# Patient Record
Sex: Female | Born: 1935 | Hispanic: Yes | Marital: Single | Smoking: Never smoker
Health system: Southern US, Community
[De-identification: ages and names within clinical notes are randomized; demographics above are authoritative.]

## PROBLEM LIST (undated history)

## (undated) DIAGNOSIS — H35039 Hypertensive retinopathy, unspecified eye: Secondary | ICD-10-CM

## (undated) DIAGNOSIS — K219 Gastro-esophageal reflux disease without esophagitis: Secondary | ICD-10-CM

## (undated) DIAGNOSIS — M069 Rheumatoid arthritis, unspecified: Secondary | ICD-10-CM

## (undated) HISTORY — DX: Hypertensive retinopathy, unspecified eye: H35.039

---

## 2006-07-09 HISTORY — PX: EYE SURGERY: SHX253

## 2006-07-09 HISTORY — PX: CATARACT EXTRACTION: SUR2

## 2016-07-09 HISTORY — PX: CHOLECYSTECTOMY, LAPAROSCOPIC: SHX56

## 2017-04-10 DIAGNOSIS — M069 Rheumatoid arthritis, unspecified: Secondary | ICD-10-CM | POA: Insufficient documentation

## 2017-04-10 DIAGNOSIS — I10 Essential (primary) hypertension: Secondary | ICD-10-CM | POA: Insufficient documentation

## 2017-04-10 DIAGNOSIS — F32A Depression, unspecified: Secondary | ICD-10-CM | POA: Insufficient documentation

## 2017-04-10 DIAGNOSIS — K219 Gastro-esophageal reflux disease without esophagitis: Secondary | ICD-10-CM | POA: Insufficient documentation

## 2017-04-10 DIAGNOSIS — M810 Age-related osteoporosis without current pathological fracture: Secondary | ICD-10-CM | POA: Insufficient documentation

## 2017-04-10 DIAGNOSIS — M6281 Muscle weakness (generalized): Secondary | ICD-10-CM | POA: Insufficient documentation

## 2017-04-10 DIAGNOSIS — D509 Iron deficiency anemia, unspecified: Secondary | ICD-10-CM | POA: Insufficient documentation

## 2017-04-10 DIAGNOSIS — F329 Major depressive disorder, single episode, unspecified: Secondary | ICD-10-CM | POA: Insufficient documentation

## 2018-12-18 ENCOUNTER — Other Ambulatory Visit: Payer: Self-pay | Admitting: Rheumatology

## 2018-12-18 DIAGNOSIS — M810 Age-related osteoporosis without current pathological fracture: Secondary | ICD-10-CM

## 2019-03-10 NOTE — Progress Notes (Addendum)
[facility] - Clinic Note  03/11/2019     CHIEF COMPLAINT Patient presents for Retina Evaluation   HISTORY OF PRESENT ILLNESS: Theresa Kim is a 83 y.o. female who presents to the clinic today for:   HPI    Retina Evaluation    In both eyes.  Onset: Unknown.  Duration of months.  Associated Symptoms Distortion and Floaters.  Negative for Flashes, Pain, Photophobia, Trauma, Jaw Claudication, Fever and Fatigue.  Context:  distance vision, mid-range vision and near vision.  Treatments tried include artificial tears.  Response to treatment was no improvement.  I, the attending physician,  performed the HPI with the patient and updated documentation appropriately.          Comments    83 y/o female pt referred by Dr. Nile Riggs for eval of ARMD OU.  Saw Dr. Nile Riggs yesterday for routine eye exam.  VA blurred OU x several months.  Also sees a "line" running through vision OU.  Denies pain, flashes, but reports a few small floaters OU.  OD frequently has FBS, stings and tears a lot.  Has tried AT in the past, but says they did nothing to relieve symptoms.       Last edited by Rennis Chris, MD on 03/11/2019 12:08 PM. (History)    Patient states she went to see Dr. Nile Riggs bc her vision has been getting blurry over the past couple of months. Patient states she has a hard time reading; and she states she has RA and takes methotrexate. Daughter states patient has taken hydroxychloroquine in the past for a significant amount of time. Stopped hydroxychloroquine last year. Significant health issues in the past due to gall bladder problems (hospice involved at one point), but patient's health has significantly improved since.   Referring physician: Jethro Bolus, MD [id number] [place],  [place] [address]  HISTORICAL INFORMATION:   Selected notes from the MEDICAL RECORD NUMBER Referred by Dr. Jethro Bolus for exu ARMD OU LEE:  Ocular Hx- PMH   CURRENT MEDICATIONS: No  current outpatient medications on file. (Ophthalmic Drugs)   No current facility-administered medications for this visit.  (Ophthalmic Drugs)   Current Outpatient Medications (Other)  Medication Sig  . benzonatate (TESSALON) 100 MG capsule TAKE 1 CAPSULE BY MOUTH THREE TIMES A DAY  . doxycycline (VIBRAMYCIN) 100 MG capsule Take 100 mg by mouth 2 (two) times daily.  . folic acid (FOLVITE) 1 MG tablet Take 1 mg by mouth daily.  Marland Kitchen gabapentin (NEURONTIN) 300 MG capsule Take 300 mg by mouth 2 (two) times daily.  . methotrexate (RHEUMATREX) 2.5 MG tablet TAKE 4 TABLET ALL AT THE SAME TIME ONCE A WEEK ORALLY 30 DAYS  . ofloxacin (FLOXIN) 0.3 % OTIC solution PLACE 10 DROPS INTO AFFECTED EAR(S) ONCE DAILY  . predniSONE (DELTASONE) 5 MG tablet   . traZODone (DESYREL) 100 MG tablet Take 100 mg by mouth daily.   No current facility-administered medications for this visit.  (Other)      REVIEW OF SYSTEMS: ROS    Positive for: Eyes   Negative for: Constitutional, Gastrointestinal, Neurological, Skin, Genitourinary, Musculoskeletal, HENT, Endocrine, Cardiovascular, Respiratory, Psychiatric, Allergic/Imm, Heme/Lymph   Last edited by Celine Mans, COA on 03/11/2019  9:12 AM. (History)       ALLERGIES No Known Allergies  PAST MEDICAL HISTORY History reviewed. No pertinent past medical history. History reviewed. No pertinent surgical history.  FAMILY HISTORY History reviewed. No pertinent family history.  SOCIAL HISTORY Social  History   Tobacco Use  . Smoking status: Not on file  Substance Use Topics  . Alcohol use: Not on file  . Drug use: Not on file         OPHTHALMIC EXAM:  Base Eye Exam    Visual Acuity (Snellen - Linear)      Right Left   Dist [place] 20/60 -2 20/50 -2   Dist ph [place] NI NI       Tonometry (Tonopen, 9:13 AM)      Right Left   Pressure 14 13       Pupils      Dark Light Shape React APD   Right 4 3 Round Slow None   Left 4 3 Round Slow None        Visual Fields (Counting fingers)      Left Right    Full Full       Extraocular Movement      Right Left    Full, Ortho Full, Ortho       Neuro/Psych    Oriented x3: Yes   Mood/Affect: Normal       Dilation    Both eyes: 1.0% Mydriacyl, 2.5% Phenylephrine @ 9:13 AM        Slit Lamp and Fundus Exam    Slit Lamp Exam      Right Left   Lids/Lashes Dermatochalasis - lower lid, mild telangectasia and MGD Dermatochalasis - lower lid, mild telangectasia and MGD   Conjunctiva/Sclera mild conjunctivochalasis mild conjunctivochalasis   Cornea 1-2 + PEE, well healed temporal cataract wound, 1-2 + PEE, well healed temporal cataract wound,   Anterior Chamber no cell or flare no cell or flare   Iris round and dilated round and dilated   Lens multifocal PCIOL, trace PCO multifocal PCIOL   Vitreous syneresis, PVD, scattered vitreous condensations syneresis, PVD, scattered vitreous condensations       Fundus Exam      Right Left   Disc mild pallor, + PPA, sharp rim trace pallor, + temp PPA, sharp rim   C/D Ratio 0.3 0.4   Macula Flat, no heme, blunted foveal reflex,inf crescent of RPE atrophy, mild ERM, RPE mottling and clumping, +plaquenil toxicity Flat, no heme blunted foveal reflex,inf crescent of RPE atrophy, mild ERM, RPE mottling and clumping, +plaquenil toxicity   Vessels AV crossing changes, Copper wiring, Tortuous, Vascular attenuation AV crossing changes, Copper wiring, Tortuous, Vascular attenuation   Periphery attached, scattered RPE changes, peripheral drusen attached, scattered RPE changes, peripheral drusen        Refraction    Manifest Refraction      Sphere Cylinder Dist [place]   Right [place] Sphere 20/60-   Left [place] Sphere 20/50-  Spent a good bit of time on the refraction, but no matter what I showed her, pt always said that pl sph OU looked the clearest.          IMAGING AND PROCEDURES  Imaging and Procedures for @TODAY @  OCT, Retina - OU - Both Eyes        Right Eye Quality was good. Central Foveal Thickness: 234. Progression has no prior data. Findings include abnormal foveal contour, no IRF, subretinal fluid, outer retinal atrophy, retinal drusen .   Left Eye Quality was good. Central Foveal Thickness: 257. Progression has no prior data. Findings include no IRF, abnormal foveal contour, retinal drusen , subretinal fluid, outer retinal atrophy.   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 CME - cystoid macular edema. PED - pigment epithelial detachment. IRF - intraretinal fluid. SRF - subretinal fluid. EZ - ellipsoid zone. ERM - epiretinal membrane. ORA - outer retinal atrophy. ORT - outer retinal tubulation. SRHM - subretinal hyper-reflective material        Fluorescein Angiography Optos (Transit OD)       Right Eye   Progression has no prior data. Early phase findings include staining, window defect. Mid/Late phase findings include staining, window defect, leakage (Mild, focal patches of hyperfluorescent leakage -- temporal periphery).   Left Eye   Progression has no prior data. Early phase findings include staining, window defect. Mid/Late phase findings include staining, window defect, leakage (Mild, focal patches of hyperfluorescent leakage -- temporal periphery).   Notes Images stored on drive;   Impression: Inferior paracentral, semi-circular staining/window defect consistent with plaquenil toxicity OU Focal patches of mild hyperfluorescent leakage, temporal periphery, consistent with focal, peripheral chorioretinal inflammation Scattered staining of peripheral drusen and peripheral RPE changes                  ASSESSMENT/PLAN:    ICD-10-CM   1. 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 Retinal edema  H35.81 OCT, Retina - OU - Both Eyes  4. Essential hypertension  I10   5. Hypertensive retinopathy of both eyes  H35.033 Fluorescein Angiography Optos (Transit OD)  6. 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 Pseudophakia of both eyes  Z96.1     1,2. Macular atrophy / Plaquenil toxicity OU  - history of long-term plaquenil use (years) for RA -- unknown dosage, but pt is very petite  - plaquenil was stopped about 1 year ago  - exam and OCT show characteristic parafoveal pattern of macular atrophy consistent with plaquenil toxicity  - discussed findings and prognosis  - no intervention indicated at this time  - monitor  - f/u 6 months, sooner prn  3. No retinal edema on exam or OCT  4,5. Hypertensive retinopathy OU - discussed importance of tight BP control - monitor  6,7. Rheumatoid arthritis w/ focal, peripheral chorioretinal inflammation OU - RA under the expert management of Dr. Zenovia JordanAngela Hawkes, Rheumatology - currently tapering off po prednisone (currently taking 7.5 mg daily) and on MTX 15mg /wk + folic acid - FA today (9.2.2020) shows mild focal patches of peripheral hyperfluorescent leakage OU - suggests there may be a mild uveitic component (focal peripheral chorioretinal inflammation) to her RA - no acute changes in therapy recommended at this time as pt is already on MTX and po prednisone - will send letter to Dr. Nickola MajorHawkes to let her know of these findings of ocular inflammation - f/u here in 6 mos, sooner prn for recheck of focal peripheral chorioretinal inflammation  8. Pseudophakia OU  - s/p CE/multifocal IOL OU [place], [place]  - beautiful surgeries, doing well  - monitor     Ophthalmic Meds Ordered this visit:  No orders of the defined types were placed in this encounter.       Return in about 6 months (around 09/08/2019) for f/u Plaquenil toxicity OU, DFE, OCT.  There are no Patient  Instructions on file for this visit.   Explained the diagnoses, plan, and follow up with the patient and they expressed understanding.  Patient expressed understanding of the importance of proper follow up care.   This document serves as a record of services personally performed by Gardiner Sleeper, MD, PhD. It was created on their behalf by Ernest Mallick, OA, an ophthalmic assistant. The creation of this record is the provider's dictation and/or activities during the visit.    Electronically signed by: Ernest Mallick, OA  09.01.2020 12:44 PM    Gardiner Sleeper, M.D., Ph.D. Diseases & Surgery of the Retina and Vitreous [facility]  I have reviewed the above documentation for accuracy and completeness, and I agree with the above. Gardiner Sleeper, M.D., Ph.D. 03/11/19 12:44 PM   Abbreviations: M myopia (nearsighted); A astigmatism; H hyperopia (farsighted); P presbyopia; Mrx spectacle prescription;  CTL contact lenses; OD right eye; OS left eye; OU both eyes  XT exotropia; ET esotropia; PEK punctate epithelial keratitis; PEE punctate epithelial erosions; DES dry eye syndrome; MGD meibomian gland dysfunction; ATs artificial tears; PFAT's preservative free artificial tears; [place] nuclear sclerotic cataract; PSC posterior subcapsular cataract; ERM epi-retinal membrane; PVD posterior vitreous detachment; RD retinal detachment; DM diabetes mellitus; DR diabetic retinopathy; NPDR non-proliferative diabetic retinopathy; PDR proliferative diabetic retinopathy; CSME clinically significant macular edema; DME diabetic macular edema; dbh dot blot hemorrhages; CWS cotton wool spot; POAG primary open angle glaucoma; C/D cup-to-disc ratio; HVF humphrey visual field; GVF goldmann visual field; OCT optical coherence tomography; IOP intraocular pressure; BRVO Branch retinal vein occlusion;  CRVO central retinal vein occlusion; CRAO central retinal artery occlusion; BRAO branch retinal artery occlusion; RT retinal tear; SB scleral buckle; PPV pars plana vitrectomy; VH Vitreous hemorrhage; PRP panretinal laser photocoagulation; IVK intravitreal kenalog; VMT vitreomacular traction; MH Macular hole;  NVD neovascularization of the disc; NVE neovascularization elsewhere; AREDS age related eye disease study; ARMD age related macular degeneration; POAG primary open angle glaucoma; EBMD epithelial/anterior basement membrane dystrophy; ACIOL anterior chamber intraocular lens; IOL intraocular lens; PCIOL posterior chamber intraocular lens; Phaco/IOL phacoemulsification with intraocular lens placement; F[place]in photorefractive keratectomy; LASIK laser assisted in situ keratomileusis; HTN hypertension; DM diabetes mellitus; COPD chronic obstructive pulmonary disease

## 2019-03-11 ENCOUNTER — Ambulatory Visit (INDEPENDENT_AMBULATORY_CARE_PROVIDER_SITE_OTHER): Payer: Medicare PPO | Admitting: Ophthalmology

## 2019-03-11 ENCOUNTER — Other Ambulatory Visit: Payer: Self-pay

## 2019-03-11 ENCOUNTER — Encounter (INDEPENDENT_AMBULATORY_CARE_PROVIDER_SITE_OTHER): Payer: Self-pay | Admitting: Ophthalmology

## 2019-03-11 DIAGNOSIS — H3589 Other specified retinal disorders: Secondary | ICD-10-CM

## 2019-03-11 DIAGNOSIS — H30003 Unspecified focal chorioretinal inflammation, bilateral: Secondary | ICD-10-CM

## 2019-03-11 DIAGNOSIS — M069 Rheumatoid arthritis, unspecified: Secondary | ICD-10-CM

## 2019-03-11 DIAGNOSIS — H3581 Retinal edema: Secondary | ICD-10-CM

## 2019-03-11 DIAGNOSIS — H35033 Hypertensive retinopathy, bilateral: Secondary | ICD-10-CM

## 2019-03-11 DIAGNOSIS — H35389 Toxic maculopathy, unspecified eye: Secondary | ICD-10-CM

## 2019-03-11 DIAGNOSIS — I10 Essential (primary) hypertension: Secondary | ICD-10-CM

## 2019-03-11 DIAGNOSIS — Z961 Presence of intraocular lens: Secondary | ICD-10-CM

## 2019-04-10 ENCOUNTER — Other Ambulatory Visit: Payer: Self-pay

## 2019-04-10 ENCOUNTER — Ambulatory Visit
Admission: RE | Admit: 2019-04-10 | Discharge: 2019-04-10 | Disposition: A | Discharge status: Home / Self Care | Payer: Medicare PPO | Source: Ambulatory Visit | Attending: Rheumatology | Admitting: Rheumatology

## 2019-04-10 DIAGNOSIS — M810 Age-related osteoporosis without current pathological fracture: Secondary | ICD-10-CM

## 2019-05-29 ENCOUNTER — Emergency Department (HOSPITAL_COMMUNITY): Payer: Medicare PPO

## 2019-05-29 ENCOUNTER — Other Ambulatory Visit: Payer: Self-pay

## 2019-05-29 ENCOUNTER — Inpatient Hospital Stay (HOSPITAL_COMMUNITY): Payer: Medicare PPO

## 2019-05-29 ENCOUNTER — Encounter (HOSPITAL_COMMUNITY): Payer: Self-pay | Admitting: Physician Assistant

## 2019-05-29 ENCOUNTER — Inpatient Hospital Stay (HOSPITAL_COMMUNITY)
Admission: EM | Admit: 2019-05-29 | Discharge: 2019-06-01 | DRG: 445 | Disposition: A | Discharge status: Home Health | Payer: Medicare PPO | Attending: Family Medicine | Admitting: Family Medicine

## 2019-05-29 DIAGNOSIS — K8050 Calculus of bile duct without cholangitis or cholecystitis without obstruction: Secondary | ICD-10-CM | POA: Diagnosis present

## 2019-05-29 DIAGNOSIS — K8309 Other cholangitis: Secondary | ICD-10-CM | POA: Diagnosis present

## 2019-05-29 DIAGNOSIS — K8030 Calculus of bile duct with cholangitis, unspecified, without obstruction: Secondary | ICD-10-CM | POA: Diagnosis present

## 2019-05-29 DIAGNOSIS — M4856XA Collapsed vertebra, not elsewhere classified, lumbar region, initial encounter for fracture: Secondary | ICD-10-CM | POA: Diagnosis present

## 2019-05-29 DIAGNOSIS — R0989 Other specified symptoms and signs involving the circulatory and respiratory systems: Secondary | ICD-10-CM | POA: Diagnosis present

## 2019-05-29 DIAGNOSIS — Z9049 Acquired absence of other specified parts of digestive tract: Secondary | ICD-10-CM

## 2019-05-29 DIAGNOSIS — R0781 Pleurodynia: Secondary | ICD-10-CM

## 2019-05-29 DIAGNOSIS — I10 Essential (primary) hypertension: Secondary | ICD-10-CM | POA: Diagnosis present

## 2019-05-29 DIAGNOSIS — K5730 Diverticulosis of large intestine without perforation or abscess without bleeding: Secondary | ICD-10-CM | POA: Diagnosis present

## 2019-05-29 DIAGNOSIS — N83202 Unspecified ovarian cyst, left side: Secondary | ICD-10-CM | POA: Diagnosis present

## 2019-05-29 DIAGNOSIS — G629 Polyneuropathy, unspecified: Secondary | ICD-10-CM | POA: Insufficient documentation

## 2019-05-29 DIAGNOSIS — K30 Functional dyspepsia: Secondary | ICD-10-CM | POA: Diagnosis present

## 2019-05-29 DIAGNOSIS — Z20828 Contact with and (suspected) exposure to other viral communicable diseases: Secondary | ICD-10-CM | POA: Diagnosis present

## 2019-05-29 DIAGNOSIS — M439 Deforming dorsopathy, unspecified: Secondary | ICD-10-CM

## 2019-05-29 DIAGNOSIS — K219 Gastro-esophageal reflux disease without esophagitis: Secondary | ICD-10-CM | POA: Diagnosis present

## 2019-05-29 DIAGNOSIS — Z79899 Other long term (current) drug therapy: Secondary | ICD-10-CM | POA: Diagnosis not present

## 2019-05-29 DIAGNOSIS — I959 Hypotension, unspecified: Secondary | ICD-10-CM | POA: Diagnosis not present

## 2019-05-29 DIAGNOSIS — Z967 Presence of other bone and tendon implants: Secondary | ICD-10-CM

## 2019-05-29 DIAGNOSIS — I4510 Unspecified right bundle-branch block: Secondary | ICD-10-CM | POA: Diagnosis present

## 2019-05-29 DIAGNOSIS — F329 Major depressive disorder, single episode, unspecified: Secondary | ICD-10-CM | POA: Diagnosis present

## 2019-05-29 DIAGNOSIS — M069 Rheumatoid arthritis, unspecified: Secondary | ICD-10-CM | POA: Diagnosis present

## 2019-05-29 DIAGNOSIS — R54 Age-related physical debility: Secondary | ICD-10-CM | POA: Diagnosis present

## 2019-05-29 DIAGNOSIS — J9811 Atelectasis: Secondary | ICD-10-CM | POA: Diagnosis present

## 2019-05-29 DIAGNOSIS — J302 Other seasonal allergic rhinitis: Secondary | ICD-10-CM | POA: Diagnosis present

## 2019-05-29 DIAGNOSIS — L940 Localized scleroderma [morphea]: Secondary | ICD-10-CM | POA: Insufficient documentation

## 2019-05-29 DIAGNOSIS — M4854XA Collapsed vertebra, not elsewhere classified, thoracic region, initial encounter for fracture: Secondary | ICD-10-CM | POA: Diagnosis present

## 2019-05-29 DIAGNOSIS — M800AXA Age-related osteoporosis with current pathological fracture, other site, initial encounter for fracture: Secondary | ICD-10-CM | POA: Diagnosis present

## 2019-05-29 DIAGNOSIS — Z7952 Long term (current) use of systemic steroids: Secondary | ICD-10-CM

## 2019-05-29 HISTORY — DX: Rheumatoid arthritis, unspecified: M06.9

## 2019-05-29 LAB — COMPREHENSIVE METABOLIC PANEL
ALT: 195 U/L — ABNORMAL HIGH (ref 0–44)
AST: 325 U/L — ABNORMAL HIGH (ref 15–41)
Albumin: 3.5 g/dL (ref 3.5–5.0)
Alkaline Phosphatase: 284 U/L — ABNORMAL HIGH (ref 38–126)
Anion gap: 16 — ABNORMAL HIGH (ref 5–15)
BUN: 19 mg/dL (ref 8–23)
CO2: 21 mmol/L — ABNORMAL LOW (ref 22–32)
Calcium: 8.9 mg/dL (ref 8.9–10.3)
Chloride: 102 mmol/L (ref 98–111)
Creatinine, Ser: 0.93 mg/dL (ref 0.44–1.00)
GFR calc Af Amer: >60 mL/min (ref >60)
GFR calc non Af Amer: 58 mL/min — ABNORMAL LOW (ref >60)
Glucose, Bld: 143 mg/dL — ABNORMAL HIGH (ref 70–99)
Potassium: 3.5 mmol/L (ref 3.5–5.1)
Sodium: 139 mmol/L (ref 135–145)
Total Bilirubin: 4.2 mg/dL — ABNORMAL HIGH (ref 0.3–1.2)
Total Protein: 6.7 g/dL (ref 6.5–8.1)

## 2019-05-29 LAB — URINALYSIS, ROUTINE W REFLEX MICROSCOPIC
Bilirubin Urine: NEGATIVE
Glucose, UA: NEGATIVE mg/dL
Hgb urine dipstick: NEGATIVE
Ketones, ur: 5 mg/dL — AB
Leukocytes,Ua: NEGATIVE
Nitrite: NEGATIVE
Protein, ur: NEGATIVE mg/dL
Specific Gravity, Urine: 1.011 (ref 1.005–1.030)
pH: 6 (ref 5.0–8.0)

## 2019-05-29 LAB — CBC
HCT: 43.7 % (ref 36.0–46.0)
Hemoglobin: 14.4 g/dL (ref 12.0–15.0)
MCH: 32.1 pg (ref 26.0–34.0)
MCHC: 33 g/dL (ref 30.0–36.0)
MCV: 97.5 fL (ref 80.0–100.0)
Platelets: 180 10*3/uL (ref 150–400)
RBC: 4.48 MIL/uL (ref 3.87–5.11)
RDW: 14.8 % (ref 11.5–15.5)
WBC: 15.6 10*3/uL — ABNORMAL HIGH (ref 4.0–10.5)
nRBC: 0 % (ref 0.0–0.2)

## 2019-05-29 LAB — CBG MONITORING, ED: Glucose-Capillary: 147 mg/dL — ABNORMAL HIGH (ref 70–99)

## 2019-05-29 LAB — SARS CORONAVIRUS 2 (TAT 6-24 HRS): SARS Coronavirus 2: NEGATIVE

## 2019-05-29 LAB — LACTIC ACID, PLASMA
Lactic Acid, Venous: 2.2 mmol/L (ref 0.5–1.9)
Lactic Acid, Venous: 1.9 mmol/L (ref 0.5–1.9)

## 2019-05-29 MED ORDER — ONDANSETRON HCL 4 MG/2ML IJ SOLN
4.0000 mg | Freq: Once | INTRAMUSCULAR | Status: AC
  Administered 2019-05-29: 4 mg via INTRAVENOUS
  Filled 2019-05-29: qty 2

## 2019-05-29 MED ORDER — ENOXAPARIN SODIUM 40 MG/0.4ML ~~LOC~~ SOLN: 40.0000 mg | SUBCUTANEOUS | Status: DC

## 2019-05-29 MED ORDER — GABAPENTIN 300 MG PO CAPS
300.0000 mg | ORAL_CAPSULE | Freq: Two times a day (BID) | ORAL | Status: DC
  Administered 2019-05-29: 300 mg via ORAL
  Filled 2019-05-29: qty 1

## 2019-05-29 MED ORDER — IOHEXOL 300 MG/ML  SOLN
100.0000 mL | Freq: Once | INTRAMUSCULAR | Status: AC | PRN
  Administered 2019-05-29: 100 mL via INTRAVENOUS

## 2019-05-29 MED ORDER — ACETAMINOPHEN 325 MG PO TABS: 650.0000 mg | ORAL_TABLET | Freq: Four times a day (QID) | ORAL | Status: DC | PRN

## 2019-05-29 MED ORDER — PIPERACILLIN-TAZOBACTAM 3.375 G IVPB 30 MIN
3.3750 g | Freq: Once | INTRAVENOUS | Status: AC
  Administered 2019-05-29: 3.375 g via INTRAVENOUS
  Filled 2019-05-29: qty 50

## 2019-05-29 MED ORDER — METHOTREXATE 2.5 MG PO TABS: 15.0000 mg | ORAL_TABLET | ORAL | Status: DC

## 2019-05-29 MED ORDER — ACETAMINOPHEN 650 MG RE SUPP: 650.0000 mg | Freq: Four times a day (QID) | RECTAL | Status: DC | PRN

## 2019-05-29 MED ORDER — DEXTROSE IN LACTATED RINGERS 5 % IV SOLN
INTRAVENOUS | Status: AC
  Administered 2019-05-29: 21:00:00 via INTRAVENOUS

## 2019-05-29 MED ORDER — SODIUM CHLORIDE 0.9 % IV BOLUS
1000.0000 mL | Freq: Once | INTRAVENOUS | Status: AC
  Administered 2019-05-29: 1000 mL via INTRAVENOUS

## 2019-05-29 MED ORDER — PREDNISONE 5 MG PO TABS
5.0000 mg | ORAL_TABLET | Freq: Every day | ORAL | Status: DC
  Administered 2019-05-30 – 2019-06-01 (×3): 5 mg via ORAL
  Filled 2019-05-29 (×4): qty 1

## 2019-05-29 MED ORDER — FOLIC ACID 1 MG PO TABS
1.0000 mg | ORAL_TABLET | Freq: Every day | ORAL | Status: DC
  Administered 2019-05-30 – 2019-06-01 (×3): 1 mg via ORAL
  Filled 2019-05-29 (×3): qty 1

## 2019-05-29 NOTE — ED Triage Notes (Signed)
Pt here accompanied by daughter who provides history-pt's aid came to the house this morning and the patient had a bowel movement on the bed which is unusual for her. Aid also noted the pt is shob and is more disoriented than usual, which daughter sts she noticed last night. Pt had injury/fall a few weeks ago and has R sided pain from that but also sts she just hurts everywhere and just wants to sleep. No hx of dementia, no sick contacts.

## 2019-05-29 NOTE — ED Notes (Signed)
Pt vomited while in X-ray.

## 2019-05-29 NOTE — H&P (View-Only) (Signed)
° °                                                                          [name]: °2:41 PM °05/29/2019 ° LOS: 0 days  ° ° °Referring Provider: Dr Floyd in ED  °Primary Care Physician:  Asenso, Philip, MD in [place] °Primary Gastroenterologist:  unassigned °Daughter Theresa Kim: [phone] ° ° °Reason for Consultation: Right abdominal pain, abnormal LFTs, dilated bile duct °  °HPI: Theresa Kim is a 83 y.o. female.  PMH Rheumatoid arthritis.  Osteoporosis.  Degenerative spine disease.  2018 laparoscopic cholecystectomy was complicated and required antibiotics and drains postoperatively.  2018 colonoscopy, patient reports this as unremarkable. °  °General patient does not have a great appetite but has not lost weight.  She has a lot of heartburn but has not alerted MD to this and does not take any acid controlling medications.  Last week she was started on the first ever dose of a monthly injection for osteoporosis °About 3 weeks ago patient reached down onto the floor to pick up a spider and felt a cracking in her right flank, thought she probably cracked a rib.  There is been pain in this area since then but pain is improving. °This morning the patient's part-time nurses aide noticed that the patient was weak, relatively disoriented, short of breath and had an episode of fecal incontinence because she could not get up out of bed.  No nausea, no chills, no fever.  She was brought to the emergency department. °BP 122/82.  Heart rate in the low 100s.  Temperature 98.7. °T bili 4.2.  Alkaline phosphatase 284.  AST/ALT 325/195.  WBCs 15.6.  Hb 14.4.  Lactic acid 2.2 >> 1.9. °CTAP w contrast shows mild intrahepatic biliary ductal dilatation, CBD 16 mm.  Faint densities within CBD.  Descending diverticulosis.  Aortic atherosclerosis.  Left ovarian cyst ° °She has received 1 dose of  Zosyn. ° ° ° ° °Past Medical History:  °Diagnosis Date  °• Rheumatoid arthritis (HCC)   ° ° ° ° °Prior to Admission medications   °Not on File  °Methotrexate.   °Prednisone 5 mg daily.   °Gabapentin 3 times daily ° °Scheduled Meds: ° °Infusions: °• piperacillin-tazobactam    ° °PRN Meds: ° ° ° °Allergies as of 05/29/2019  °• (No Known Allergies)  ° ° °No family history on file. ° °Social History  ° °Socioeconomic History  °• Marital status: Single  °  Spouse name: Not on file  °• Number of children: Not on file  °• Years of education: Not on file  °• Highest education level: Not on file  °Occupational History  °• Not on file  °Social Needs  °• Financial resource strain: Not on file  °• Food insecurity  °  Worry: Not on file  °  Inability: Not on file  °• Transportation needs  °  Medical: Not on file  °  Non-medical: Not on file  °Tobacco Use  °• Smoking status: Not on file  °Substance and Sexual Activity  °• Alcohol use: Not on file  °• Drug use: Not on file  °• Sexual activity: Not on file  °Lifestyle  °•   Physical activity    Days per week: Not on file    Minutes per session: Not on file   Stress: Not on file  Relationships   Social connections    Talks on phone: Not on file    Gets together: Not on file    Attends religious service: Not on file    Active member of club or organization: Not on file    Attends meetings of clubs or organizations: Not on file    Relationship status: Not on file   Intimate partner violence    Fear of current or ex partner: Not on file    Emotionally abused: Not on file    Physically abused: Not on file    Forced sexual activity: Not on file  Other Topics Concern   Not on file  Social History Narrative   Not on file    REVIEW OF SYSTEMS: Constitutional: No new fatigue.  Feels weak today but generally stable amount of energy.  Not very mobile. ENT:  No nose bleeds Pulm: Short of breath this morning, not currently.  No cough. CV:  No palpitations, no LE  edema.  No chest pain/angina GU:  No hematuria, no frequency GI: See HPI.  No dysphagia. Heme: No unusual bleeding or bruising. Transfusions: None. Neuro:  No headaches, no peripheral tingling or numbness.  No dizziness, no syncope, no seizures. Derm:  No itching, no rash or sores.  Endocrine:  No sweats or chills.  No polyuria or dysuria Immunization: Received flu shot 04/24/2019 Travel:  None     PHYSICAL EXAM: Vital signs in last 24 hours: Vitals:   05/29/19 1130 05/29/19 1140  BP:  (!) 118/50  Pulse: (!) 105 (!) 112  Resp: (!) 24 (!) 25  Temp:    SpO2: 94% 95%   Wt Readings from Last 3 Encounters:  No data found for Wt    General: Pleasant, somewhat frail, petite, not acutely ill-appearing Head: No facial asymmetry or swelling.  No signs of head trauma. Eyes: No scleral icterus.  No conjunctival pallor.  EOMI. Ears: Not hard of hearing Nose: No congestion no discharge. Mouth: Poor dentition, many teeth missing.  Mucosa moist, pink, clear.  Tongue midline. Neck: No JVD, no masses, no thyromegaly. Lungs: Clear bilaterally.  Good breath sounds.  No labored breathing or cough. Heart: RRR.  No MRG.  S1, S2 present. Abdomen: Not tender, not distended.  Soft.  Active bowel sounds.  No HSM, masses, bruits, hernias.   Rectal: Deferred Musc/Skeltl: No joint redness, swelling or gross deformity. Extremities: No CCE. Neurologic: Alert.  Appropriate.  Fully oriented.  Moves all 4 limbs, strength not tested.  No tremors or involuntary movements. Skin: No rash, no sores, no significant bruising. Tattoos: None Nodes: No cervical adenopathy Psych: Calm, pleasant, cooperative.  Intake/Output from previous day: No intake/output data recorded. Intake/Output this shift: No intake/output data recorded.  LAB RESULTS: Recent Labs    05/29/19 0947  WBC 15.6*  HGB 14.4  HCT 43.7  PLT 180   BMET Lab Results  Component Value Date   NA 139 05/29/2019   K 3.5 05/29/2019   CL 102  05/29/2019   CO2 21 (L) 05/29/2019   GLUCOSE 143 (H) 05/29/2019   BUN 19 05/29/2019   CREATININE 0.93 05/29/2019   CALCIUM 8.9 05/29/2019   LFT Recent Labs    05/29/19 0947  PROT 6.7  ALBUMIN 3.5  AST 325*  ALT 195*  ALKPHOS 284*  BILITOT 4.2*  PT/INR No results found for: INR, PROTIME Hepatitis Panel No results for input(s): HEPBSAG, HCVAB, HEPAIGM, HEPBIGM in the last 72 hours. C-Diff No components found for: CDIFF Lipase  No results found for: LIPASE  Drugs of Abuse  No results found for: LABOPIA, COCAINSCRNUR, LABBENZ, AMPHETMU, THCU, LABBARB   RADIOLOGY STUDIES: Dg Chest 2 View  Result Date: 05/29/2019 CLINICAL DATA:  Shortness of breath. EXAM: CHEST - 2 VIEW COMPARISON:  No pertinent prior studies available for comparison. FINDINGS: Evaluation of heart size is limited due to shallow inspiration. Aortic atherosclerosis. Shallow inspiration radiograph with crowding of the central bronchovascular markings. Bibasilar opacities (greater on the left) have an appearance most suggestive of incomplete atelectasis. No evidence of pneumothorax or sizable pleural effusion. Multiple age-indeterminate compression deformities within the mid to lower thoracic and upper lumbar spine. Surgical clips project over the right upper quadrant of the abdomen. IMPRESSION: Shallow inspiration radiograph. Probable mild pulmonary vascular congestion. Ill-defined bibasilar opacities have an appearance most suggestive of incomplete atelectasis. Multiple age-indeterminate compression deformities within the mid to lower thoracic and upper lumbar spine. Electronically Signed   By: Jackey LogeKyle  Golden DO   On: 05/29/2019 10:11   Ct Abdomen Pelvis W Contrast  Result Date: 05/29/2019 CLINICAL DATA:  Acute generalized abdominal pain. EXAM: CT ABDOMEN AND PELVIS WITH CONTRAST TECHNIQUE: Multidetector CT imaging of the abdomen and pelvis was performed using the standard protocol following bolus administration of  intravenous contrast. CONTRAST:  80 mL OMNIPAQUE IOHEXOL 300 MG/ML  SOLN COMPARISON:  None. FINDINGS: Lower chest: No acute abnormality. Hepatobiliary: Status post cholecystectomy. Mild intrahepatic dilatation is noted with severely dilated common bile duct measuring 16 mm which may be due to post cholecystectomy status, but there does appear to be faint densities within the bile duct. No focal hepatic abnormality is noted. Pancreas: Unremarkable. No pancreatic ductal dilatation or surrounding inflammatory changes. Spleen: Normal in size without focal abnormality. Adrenals/Urinary Tract: Adrenal glands are unremarkable. Kidneys are normal, without renal calculi, focal lesion, or hydronephrosis. Bladder is unremarkable. Stomach/Bowel: Stomach is within normal limits. Appendix appears normal. No evidence of bowel wall thickening, distention, or inflammatory changes. Diverticulosis of descending colon is noted without inflammation. Vascular/Lymphatic: Aortic atherosclerosis. No enlarged abdominal or pelvic lymph nodes. Reproductive: Uterus is unremarkable. No right adnexal abnormality is noted. 2.4 cm left ovarian cyst is noted. Other: No abdominal wall hernia or abnormality. No abdominopelvic ascites. Musculoskeletal: No acute or significant osseous findings. IMPRESSION: Mild intrahepatic biliary ductal dilatation is noted, with severely dilated common bile duct. This may simply be due to post cholecystectomy status, but there does appear to be faint densities within the dilated common bile duct which may represent stones. Further evaluation with MRCP is recommended. 2.4 cm left ovarian cyst is noted. This most likely is benign, but follow-up ultrasound in 1 year is recommended to ensure stability. Diverticulosis of descending colon without inflammation. Aortic Atherosclerosis (ICD10-I70.0). Electronically Signed   By: Lupita RaiderJames  Green Jr M.D.   On: 05/29/2019 13:26     IMPRESSION:   *   Elevated LFTs, elevated WBCs  but no fever..   Dilated biliary tree w 16 mm CBD and s/o CBD stones per CT.  Cholecystectomy 2018 with somewhat complicated postop course requiring drainage procedures, antibiotics.  May have had a ERCP at that time.   Has received 1 dose of Zosyn.  *   RA.  On chronic methotrexate and low-dose prednisone.    PLAN:     *  MRI/MRCP.  Ordered by ED  staff  *    Needs Covid testing in case ERCP required, the test is ordered, not collected yet.  *   Note to admitting MD: please do not give Heparin, lovenox for DVT prevention.  She may need ERCP and blood thinners will delay procedures.     Azucena Freed  05/29/2019, 2:41 PM Phone [phone]   I have reviewed the entire case in detail with the above APP and discussed the plan in detail. In addition,  I have personally interviewed and examined the patient and have personally reviewed any abdominal/pelvic CT scan images.  My additional thoughts are as follows:  I had a prolonged visit with this patient and her daughter Theresa Kim in the emergency department this evening.  After evaluating everything, I have determined that the clinical suspicion for choledocholithiasis and cholangitis is high enough that MRCP is unnecessary.  She has signs and symptoms of choledocholithiasis and cholangitis, total bilirubin over 4 and marked biliary ductal dilatation means she should go to ERCP.  She is immune compromised and elderly, thus at high risk of decompensation, so antibiotics have been started.  The plan is for an ERCP tomorrow with my associate Dr. Alexis Kim, who is acting as my backup biliary endoscopist for the weekend.  Curiously, the patient has bilateral fine crackles, but is not clinically in heart failure.  I think she is probably volume depleted with underlying sepsis.  No cardiomegaly to suggest CHF.  It seems to me that the reported pulmonary vascular congestion is really under inflation/low lung volumes on his chest x-ray.  Although the  daughter does not know of any such history, I wonder if the patient may have underlying interstitial lung disease from her rheumatoid arthritis or perhaps methotrexate.  Her blood pressure was 90/50 when I saw her, but she is mentating well and not tachycardic.  I do not know what her baseline blood pressure runs, but I suspect she is somewhat volume depleted and certainly at risk for greater volume depletion and hemodynamic collapse with underlying infection.  Therefore, I started D5 LR at 100 cc an hour.  INR also ordered for tomorrow morning to go along with the CBC and CMP already ordered.  I recommended ERCP with sphincterotomy and stone extraction with possible stent placement tomorrow.  I described this procedure in detail, drawing a diagram of the anatomy and how the procedures performed, and explained all the risks and benefits in detail.  The benefits and risks of the planned procedure were described in detail with the patient or (when appropriate) their health care proxy.  Risks were outlined as including, but not limited to, bleeding, infection, perforation, adverse medication reaction leading to cardiac or pulmonary decompensation, pancreatitis (if ERCP).  The limitation of incomplete mucosal visualization was also discussed.  No guarantees or warranties were given. Patient at increased risk for cardiopulmonary complications of procedure due to medical comorbidities.   They were agreeable to having the procedure performed, and understand that it will be with Dr. Therisa Kim, whom they will meet tomorrow. I discussed the case with Dr. Therisa Kim over the phone this evening, and we will reevaluate the patient tomorrow.  Covid test is pending.  Patient currently without nasopharyngeal or respiratory symptoms of Covid.  SCDs were ordered since she is not on anticoagulation for DVT prophylaxis with anticipated procedure tomorrow.  Nelida Meuse III Office:[phone]

## 2019-05-29 NOTE — Consult Note (Addendum)
[facility] Consult: 2:41 PM 05/29/2019  LOS: 0 days    Referring Provider: Dr Adela LankFloyd in ED  Primary Care Physician:  Annita BrodAsenso, Philip, [facility] Primary [facility]astroenterologist:  unassigned Daughter Michell HeinrichMaria Rossi: [phone]   Reason for Consultation: Right abdominal pain, abnormal LFTs, dilated bile duct   HPI: Theresa Kim is a 83 y.o. female.  PMH Rheumatoid arthritis.  Osteoporosis.  Degenerative spine disease.  2018 laparoscopic cholecystectomy was complicated and required antibiotics and drains postoperatively.  2018 colonoscopy, patient reports this as unremarkable.   General patient does not have a great appetite but has not lost weight.  She has a lot of heartburn but has not alerted MD to this and does not take any acid controlling medications.  Last week she was started on the first ever dose of a monthly injection for osteoporosis About 3 weeks ago patient reached down onto the floor to pick up a spider and felt a cracking in her right flank, thought she probably cracked a rib.  There is been pain in this area since then but pain is improving. This morning the patient's part-time nurses aide noticed that the patient was weak, relatively disoriented, short of breath and had an episode of fecal incontinence because she could not get up out of bed.  No nausea, no chills, no fever.  She was brought to the emergency department. BP 122/82.  Heart rate in the low 100s.  Temperature 98.7. T bili 4.2.  Alkaline phosphatase 284.  AST/ALT 325/195.  WBCs 15.6.  Hb 14.4.  Lactic acid 2.2 >> 1.9. CTAP w contrast shows mild intrahepatic biliary ductal dilatation, CBD 16 mm.  Faint densities within CBD.  Descending diverticulosis.  Aortic atherosclerosis.  Left ovarian cyst  She has received 1 dose of  Zosyn.     Past Medical History:  Diagnosis Date   Rheumatoid arthritis (HCC)       Prior to Admission medications   Not on File  Methotrexate.   Prednisone 5 mg daily.   Gabapentin 3 times daily  Scheduled Meds:  Infusions:  piperacillin-tazobactam     PRN Meds:    Allergies as of 05/29/2019   (No Known Allergies)    No family history on file.  Social History   Socioeconomic History   Marital status: Single    Spouse name: Not on file   Number of children: Not on file   Years of education: Not on file   Highest education level: Not on file  Occupational History   Not on file  Social Needs   Financial resource strain: Not on file   Food insecurity    Worry: Not on file    Inability: Not on file   Transportation needs    Medical: Not on file    Non-medical: Not on file  Tobacco Use   Smoking status: Not on file  Substance and Sexual Activity   Alcohol use: Not on file   Drug use: Not on file   Sexual activity: Not on file  Lifestyle  Physical activity    Days per week: Not on file    Minutes per session: Not on file   Stress: Not on file  Relationships   Social connections    Talks on phone: Not on file    Gets together: Not on file    Attends religious service: Not on file    Active member of club or organization: Not on file    Attends meetings of clubs or organizations: Not on file    Relationship status: Not on file   Intimate partner violence    Fear of current or ex partner: Not on file    Emotionally abused: Not on file    Physically abused: Not on file    Forced sexual activity: Not on file  Other Topics Concern   Not on file  Social History Narrative   Not on file    REVIEW OF SYSTEMS: Constitutional: No new fatigue.  Feels weak today but generally stable amount of energy.  Not very mobile. ENT:  No nose bleeds Pulm: Short of breath this morning, not currently.  No cough. CV:  No palpitations, no LE  edema.  No chest pain/angina GU:  No hematuria, no frequency GI: See HPI.  No dysphagia. Heme: No unusual bleeding or bruising. Transfusions: None. Neuro:  No headaches, no peripheral tingling or numbness.  No dizziness, no syncope, no seizures. Derm:  No itching, no rash or sores.  Endocrine:  No sweats or chills.  No polyuria or dysuria Immunization: Received flu shot 04/24/2019 Travel:  None     PHYSICAL EXAM: Vital signs in last 24 hours: Vitals:   05/29/19 1130 05/29/19 1140  BP:  (!) 118/50  Pulse: (!) 105 (!) 112  Resp: (!) 24 (!) 25  Temp:    SpO2: 94% 95%   Wt Readings from Last 3 Encounters:  No data found for Wt    General: Pleasant, somewhat frail, petite, not acutely ill-appearing Head: No facial asymmetry or swelling.  No signs of head trauma. Eyes: No scleral icterus.  No conjunctival pallor.  EOMI. Ears: Not hard of hearing Nose: No congestion no discharge. Mouth: Poor dentition, many teeth missing.  Mucosa moist, pink, clear.  Tongue midline. Neck: No JVD, no masses, no thyromegaly. Lungs: Clear bilaterally.  Good breath sounds.  No labored breathing or cough. Heart: RRR.  No MRG.  S1, S2 present. Abdomen: Not tender, not distended.  Soft.  Active bowel sounds.  No HSM, masses, bruits, hernias.   Rectal: Deferred Musc/Skeltl: No joint redness, swelling or gross deformity. Extremities: No CCE. Neurologic: Alert.  Appropriate.  Fully oriented.  Moves all 4 limbs, strength not tested.  No tremors or involuntary movements. Skin: No rash, no sores, no significant bruising. Tattoos: None Nodes: No cervical adenopathy Psych: Calm, pleasant, cooperative.  Intake/Output from previous day: No intake/output data recorded. Intake/Output this shift: No intake/output data recorded.  LAB RESULTS: Recent Labs    05/29/19 0947  WBC 15.6*  HGB 14.4  HCT 43.7  PLT 180   BMET Lab Results  Component Value Date   NA 139 05/29/2019   K 3.5 05/29/2019   CL 102  05/29/2019   CO2 21 (L) 05/29/2019   GLUCOSE 143 (H) 05/29/2019   BUN 19 05/29/2019   CREATININE 0.93 05/29/2019   CALCIUM 8.9 05/29/2019   LFT Recent Labs    05/29/19 0947  PROT 6.7  ALBUMIN 3.5  AST 325*  ALT 195*  ALKPHOS 284*  BILITOT 4.2*  PT/INR No results found for: INR, PROTIME Hepatitis Panel No results for input(s): HEPBSAG, HCVAB, HEPAIGM, HEPBIGM in the last 72 hours. C-Diff No components found for: CDIFF Lipase  No results found for: LIPASE  Drugs of Abuse  No results found for: LABOPIA, COCAINSCRNUR, LABBENZ, AMPHETMU, THCU, LABBARB   RADIOLOGY STUDIES: Dg Chest 2 View  Result Date: 05/29/2019 CLINICAL DATA:  Shortness of breath. EXAM: CHEST - 2 VIEW COMPARISON:  No pertinent prior studies available for comparison. FINDINGS: Evaluation of heart size is limited due to shallow inspiration. Aortic atherosclerosis. Shallow inspiration radiograph with crowding of the central bronchovascular markings. Bibasilar opacities (greater on the left) have an appearance most suggestive of incomplete atelectasis. No evidence of pneumothorax or sizable pleural effusion. Multiple age-indeterminate compression deformities within the mid to lower thoracic and upper lumbar spine. Surgical clips project over the right upper quadrant of the abdomen. IMPRESSION: Shallow inspiration radiograph. Probable mild pulmonary vascular congestion. Ill-defined bibasilar opacities have an appearance most suggestive of incomplete atelectasis. Multiple age-indeterminate compression deformities within the mid to lower thoracic and upper lumbar spine. Electronically Signed   By: Jackey LogeKyle  Golden DO   On: 05/29/2019 10:11   Ct Abdomen Pelvis W Contrast  Result Date: 05/29/2019 CLINICAL DATA:  Acute generalized abdominal pain. EXAM: CT ABDOMEN AND PELVIS WITH CONTRAST TECHNIQUE: Multidetector CT imaging of the abdomen and pelvis was performed using the standard protocol following bolus administration of  intravenous contrast. CONTRAST:  80 mL OMNIPAQUE IOHEXOL 300 MG/ML  SOLN COMPARISON:  None. FINDINGS: Lower chest: No acute abnormality. Hepatobiliary: Status post cholecystectomy. Mild intrahepatic dilatation is noted with severely dilated common bile duct measuring 16 mm which may be due to post cholecystectomy status, but there does appear to be faint densities within the bile duct. No focal hepatic abnormality is noted. Pancreas: Unremarkable. No pancreatic ductal dilatation or surrounding inflammatory changes. Spleen: Normal in size without focal abnormality. Adrenals/Urinary Tract: Adrenal glands are unremarkable. Kidneys are normal, without renal calculi, focal lesion, or hydronephrosis. Bladder is unremarkable. Stomach/Bowel: Stomach is within normal limits. Appendix appears normal. No evidence of bowel wall thickening, distention, or inflammatory changes. Diverticulosis of descending colon is noted without inflammation. Vascular/Lymphatic: Aortic atherosclerosis. No enlarged abdominal or pelvic lymph nodes. Reproductive: Uterus is unremarkable. No right adnexal abnormality is noted. 2.4 cm left ovarian cyst is noted. Other: No abdominal wall hernia or abnormality. No abdominopelvic ascites. Musculoskeletal: No acute or significant osseous findings. IMPRESSION: Mild intrahepatic biliary ductal dilatation is noted, with severely dilated common bile duct. This may simply be due to post cholecystectomy status, but there does appear to be faint densities within the dilated common bile duct which may represent stones. Further evaluation with MRCP is recommended. 2.4 cm left ovarian cyst is noted. This most likely is benign, but follow-up ultrasound in 1 year is recommended to ensure stability. Diverticulosis of descending colon without inflammation. Aortic Atherosclerosis (ICD10-I70.0). Electronically Signed   By: Lupita RaiderJames  Green Jr M.D.   On: 05/29/2019 13:26     IMPRESSION:   *   Elevated LFTs, elevated WBCs  but no fever..   Dilated biliary tree w 16 mm CBD and s/o CBD stones per CT.  Cholecystectomy 2018 with somewhat complicated postop course requiring drainage procedures, antibiotics.  May have had a ERCP at that time.   Has received 1 dose of Zosyn.  *   RA.  On chronic methotrexate and low-dose prednisone.    PLAN:     *  MRI/MRCP.  Ordered by ED  staff  *    Needs Covid testing in case ERCP required, the test is ordered, not collected yet.  *   Note to admitting MD: please do not give Heparin, lovenox for DVT prevention.  She may need ERCP and blood thinners will delay procedures.     Azucena Freed  05/29/2019, 2:41 PM Phone [phone]   I have reviewed the entire case in detail with the above APP and discussed the plan in detail. In addition,  I have personally interviewed and examined the patient and have personally reviewed any abdominal/pelvic CT scan images.  My additional thoughts are as follows:  I had a prolonged visit with this patient and her daughter Verdis Frederickson in the emergency department this evening.  After evaluating everything, I have determined that the clinical suspicion for choledocholithiasis and cholangitis is high enough that MRCP is unnecessary.  She has signs and symptoms of choledocholithiasis and cholangitis, total bilirubin over 4 and marked biliary ductal dilatation means she should go to ERCP.  She is immune compromised and elderly, thus at high risk of decompensation, so antibiotics have been started.  The plan is for an ERCP tomorrow with my associate Dr. Alexis Frock, who is acting as my backup biliary endoscopist for the weekend.  Curiously, the patient has bilateral fine crackles, but is not clinically in heart failure.  I think she is probably volume depleted with underlying sepsis.  No cardiomegaly to suggest CHF.  It seems to me that the reported pulmonary vascular congestion is really under inflation/low lung volumes on his chest x-ray.  Although the  daughter does not know of any such history, I wonder if the patient may have underlying interstitial lung disease from her rheumatoid arthritis or perhaps methotrexate.  Her blood pressure was 90/50 when I saw her, but she is mentating well and not tachycardic.  I do not know what her baseline blood pressure runs, but I suspect she is somewhat volume depleted and certainly at risk for greater volume depletion and hemodynamic collapse with underlying infection.  Therefore, I started D5 LR at 100 cc an hour.  INR also ordered for tomorrow morning to go along with the CBC and CMP already ordered.  I recommended ERCP with sphincterotomy and stone extraction with possible stent placement tomorrow.  I described this procedure in detail, drawing a diagram of the anatomy and how the procedures performed, and explained all the risks and benefits in detail.  The benefits and risks of the planned procedure were described in detail with the patient or (when appropriate) their health care proxy.  Risks were outlined as including, but not limited to, bleeding, infection, perforation, adverse medication reaction leading to cardiac or pulmonary decompensation, pancreatitis (if ERCP).  The limitation of incomplete mucosal visualization was also discussed.  No guarantees or warranties were given. Patient at increased risk for cardiopulmonary complications of procedure due to medical comorbidities.   They were agreeable to having the procedure performed, and understand that it will be with Dr. [name], whom they will meet tomorrow. I discussed the case with Dr. [name] over the phone this evening, and we will reevaluate the patient tomorrow.  Covid test is pending.  Patient currently without nasopharyngeal or respiratory symptoms of Covid.  SCDs were ordered since she is not on anticoagulation for DVT prophylaxis with anticipated procedure tomorrow.  Nelida Meuse III Office:[phone]

## 2019-05-29 NOTE — H&P (Addendum)
[facility] Ad[facility] History and Physical Service Pager: [phone]  Patient name: Theresa Kim Medical record number: [id number] Date of birth: 02/11/1936 Age: 83 y.o. Gender: female  Primary Care Provider: Annita BrodAsenso, Philip, Kim Consultants: Theresa Kim  Code Status: Full code Preferred Emergency Contact: Theresa HeinrichMaria Kim [phone] (daughter)   Chief Complaint: Shortness of breath  Assessment and Plan: Theresa Kim is a 83 y.o. female presenting with shortness of breath for 1 day. PMH is significant for cholecystitis status post cholecystectomy, RA.   Choledocholithiasis: Patient presenting today with recent history of right upper quadrant abdominal pain.  She also endorses some nausea and feeling of upset stomach. Her home health nurse evaluated her this morning and was concerned that she didn't look good and was breathing very fast. Transaminases are elevated on CMP on admission.  Total bilirubin also elevated at 4.2.  Patient with a leukocytosis of 15.6.  Lactic acid slightly improved from 2.2 down to 1.9.  CT of the abdomen/pelvis on admission shows mild intrahepatic biliary ductal dilation with severely dilated common bile duct.  Patient has a history of cholecystectomy in 2018 that led to a complicated course of healing.  Patient has also had associated shortness of breath 2/2 pain. No nausea or vomiting. Patient has had heartburn and indigestion but denies any dark stools. Otherwise, vital signs are stable, patient is afebrile.  Blood cultures drawn in the emergency department.  The patient also received 1 L of normal saline and a dose of Zosyn in the ED. -Admit to inpatient, attending Theresa Kim -Theresa Kim consulted - planning either MRCP or ERCP, appreciate recs -Continuous cardiac/pulse ox monitoring -Follow-up blood cultures -A.m. CBC, CMP -PT/INR -Vitals per routine -Ambulate with assistance -PT/OT eval -N.p.o. -Tylenol as needed pain -IV fluids D5 LR 100  cc/hour -Zofran as needed nausea/vomiting -HOLD COAGULATION DUE TO PROCEDURE  Shortness of breath Patient presents because she had a shortness of breath for a 1 day. She reports that 2-3 weeks ago she bent to the side and felt a pop or click in her right rib cage, and it has hurt ever since. Because of this pain she has been taking more shallow breaths. Patient notes increased congestion but no sneezing. She does have seasonal allergies.  Chest x-ray in the ED shows shallow inspiration radiograph with probable mild pulmonary vascular congestion.  Ill-defined bibasilar opacities have an appearance suggestive of incomplete atelectasis.    On exam patient has crackles in left upper and lower lung fields and right lower lung field.  CBC showed WBC 15.6, lactic acid 2.2 > 1.9. EKG showed sinus tachycardia with a right bundle branch block, blood cultures drawn.  Patient has tachypneic breathing approximately 30 to 35 bpm.  Differential includes pneumonia, atelectasis. Lower on the differential is pulmonary embolus.   -Consider repeat chest x-ray for better definition. -Consider chest CT -Consider antibiotic coverage for pneumonia.  Most likely strep pneumo  Rheumatoid arthritis Patient has a history of rheumatoid arthritis.  She takes prednisone 5mg  daily, has been on this for several months. She also takes methotrexate and gabapentin. -Continue home medications  GERD Patient has issues with reflux and takes PPI approximately 3 times weekly. She is supposed to take the medicine daily -Continue PPI daily   Osteoporosis: Imaging on admission shows "Multiple age-indeterminate compression deformities within the mid to lower thoracic and lumbar spine". Patient's last DEXA scan in October 2020 showed T-score of 5.1. This patient is on methotrexate and prednisone for her rheumatoid arthritis, as well as  gabapentin for neuropathic pains.  The patient also has a history of height loss, although it is unclear  exactly how much she has lost.  The patient also has a history of fractures to her coccyx and left wrist.  My suspicion is the patient has a right rib fracture sustained 2-3 weeks prior which is led to increased pain and therefore more shallow breathing, causing her atelectasis and putting her at risk for pneumonia. -Outpatient recs: Recommend PCP continue to titrate down patient's prednisone, Start treatment for osteoporosis outpatient -Inpatient recs: Switch PPI to Pepcid  FEN/Theresa Kim: N.p.o. at this time Prophylaxis: None at this time  Disposition: Admit to teaching service  History of Present Illness:  Theresa Kim is a 83 y.o. female presenting for shortness of breath and right-sided pain.  She reportedly woke up this morning slightly confused.  She had some right-sided pain and her home health nurse was called to evaluate.  Her home health nurse said that she was short of breath and that she needed to come to the hospital.  This is reportedly been going on for longer than the day but has just gotten worse today.  Patient had one episode of emesis in the emergency department.  Review Of Systems: Per HPI with the following additions:   Review of Systems  Constitutional: Negative for chills and fever.  HENT: Negative for congestion, sinus pain, sore throat and tinnitus.   Eyes: Negative for blurred vision.  Respiratory: Positive for sputum production. Negative for cough.   Cardiovascular: Negative for chest pain and leg swelling.  Gastrointestinal: Positive for abdominal pain, heartburn and vomiting. Negative for blood in stool, constipation, diarrhea, melena and nausea.  Genitourinary: Negative for dysuria.  Neurological: Negative for dizziness, weakness and headaches.  Psychiatric/Behavioral: Positive for substance abuse.    Patient Active Problem List   Diagnosis Date Noted  . Choledocholithiasis 05/29/2019    Past Medical History: Past Medical History:  Diagnosis Date  .  Rheumatoid arthritis (HCC)   Reflux   Past Surgical History: Cholecystectomy 2018 Ortho surgery on left arm pins present  Social History: Social History   Tobacco Use  . Smoking status: Not on file  Substance Use Topics  . Alcohol use: Not on file  . Drug use: Not on file   Additional social history: Denies alcohol or drug use Please also refer to relevant sections of EMR.  Family History: No pertinent family history  Allergies and Medications: No Known Allergies No current facility-administered medications on file prior to encounter.    Current Outpatient Medications on File Prior to Encounter  Medication Sig Dispense Refill  . folic acid (FOLVITE) 1 MG tablet Take 1 mg by mouth daily.    Marland Kitchen gabapentin (NEURONTIN) 300 MG capsule Take 300 mg by mouth 3 (three) times daily.     . methotrexate (RHEUMATREX) 2.5 MG tablet Take 15 mg by mouth once a week. Wednesdays    . OMEPRAZOLE PO Take 1 capsule by mouth as needed (acid reflux).    . predniSONE (DELTASONE) 5 MG tablet Take 5 mg by mouth daily.      Objective: BP (!) 110/59 (BP Location: Right Arm)   Pulse 90   Temp 98.7 F (37.1 C) (Oral)   Resp 19   SpO2 96%  Physical Exam  Constitutional: She is well-developed, well-nourished, and in no distress. No distress.  HENT:  Head: Normocephalic and atraumatic.  Eyes: Pupils are equal, round, and reactive to light. EOM are normal.  Neck: Normal  range of motion. Neck supple.  Cardiovascular: Normal rate and regular rhythm. Exam reveals no friction rub.  No murmur heard. Pulmonary/Chest: She has no rales. She exhibits no tenderness.  Tachypneic in the 30-35 range.  Crackles and left upper and lower lung field and right lower lung field  Abdominal: Soft. Bowel sounds are normal. She exhibits no mass. There is abdominal tenderness (Right upper quadrant).  Musculoskeletal: Normal range of motion.        General: No deformity or edema.  Neurological: She is alert.  Skin: Skin  is warm and dry. No rash noted. She is not diaphoretic. No erythema.    Labs and Imaging: CBC BMET  Recent Labs  Lab 05/29/19 0947  WBC 15.6*  HGB 14.4  HCT 43.7  PLT 180   Recent Labs  Lab 05/29/19 0947  NA 139  K 3.5  CL 102  CO2 21*  BUN 19  CREATININE 0.93  GLUCOSE 143*  CALCIUM 8.9     Lactic acid-2.2> 1.9  EKG: Sinus tachycardia with right bundle branch block  Dg Chest 2 View  Result Date: 05/29/2019 CLINICAL DATA:  Shortness of breath. EXAM: CHEST - 2 VIEW COMPARISON:  No pertinent prior studies available for comparison. FINDINGS: Evaluation of heart size is limited due to shallow inspiration. Aortic atherosclerosis. Shallow inspiration radiograph with crowding of the central bronchovascular markings. Bibasilar opacities (greater on the left) have an appearance most suggestive of incomplete atelectasis. No evidence of pneumothorax or sizable pleural effusion. Multiple age-indeterminate compression deformities within the mid to lower thoracic and upper lumbar spine. Surgical clips project over the right upper quadrant of the abdomen. IMPRESSION: Shallow inspiration radiograph. Probable mild pulmonary vascular congestion. Ill-defined bibasilar opacities have an appearance most suggestive of incomplete atelectasis. Multiple age-indeterminate compression deformities within the mid to lower thoracic and upper lumbar spine. Electronically Signed   By: Kellie Simmering DO   On: 05/29/2019 10:11   Dg Forearm Left  Result Date: 05/29/2019 CLINICAL DATA:  Dental bone fixation hardware in place. EXAM: LEFT FOREARM - 2 VIEW COMPARISON:  None. FINDINGS: There is a plate and screws along the volar aspect of the distal left radius. The fracture of the distal radius has completely healed. There is an old healed fracture of the distal ulna with secondary deformity. There is diffuse osteopenia at the wrist. No acute abnormality. IMPRESSION: No acute abnormality. Old healed fractures of the  distal left radius and ulna. Metal plate and screws along the volar aspect of the distal left radius. Electronically Signed   By: Lorriane Shire M.D.   On: 05/29/2019 17:10   Ct Abdomen Pelvis W Contrast  Result Date: 05/29/2019 CLINICAL DATA:  Acute generalized abdominal pain. EXAM: CT ABDOMEN AND PELVIS WITH CONTRAST TECHNIQUE: Multidetector CT imaging of the abdomen and pelvis was performed using the standard protocol following bolus administration of intravenous contrast. CONTRAST:  80 mL OMNIPAQUE IOHEXOL 300 MG/ML  SOLN COMPARISON:  None. FINDINGS: Lower chest: No acute abnormality. Hepatobiliary: Status post cholecystectomy. Mild intrahepatic dilatation is noted with severely dilated common bile duct measuring 16 mm which may be due to post cholecystectomy status, but there does appear to be faint densities within the bile duct. No focal hepatic abnormality is noted. Pancreas: Unremarkable. No pancreatic ductal dilatation or surrounding inflammatory changes. Spleen: Normal in size without focal abnormality. Adrenals/Urinary Tract: Adrenal glands are unremarkable. Kidneys are normal, without renal calculi, focal lesion, or hydronephrosis. Bladder is unremarkable. Stomach/Bowel: Stomach is within normal limits. Appendix appears  normal. No evidence of bowel wall thickening, distention, or inflammatory changes. Diverticulosis of descending colon is noted without inflammation. Vascular/Lymphatic: Aortic atherosclerosis. No enlarged abdominal or pelvic lymph nodes. Reproductive: Uterus is unremarkable. No right adnexal abnormality is noted. 2.4 cm left ovarian cyst is noted. Other: No abdominal wall hernia or abnormality. No abdominopelvic ascites. Musculoskeletal: No acute or significant osseous findings. IMPRESSION: Mild intrahepatic biliary ductal dilatation is noted, with severely dilated common bile duct. This may simply be due to post cholecystectomy status, but there does appear to be faint densities  within the dilated common bile duct which may represent stones. Further evaluation with MRCP is recommended. 2.4 cm left ovarian cyst is noted. This most likely is benign, but follow-up ultrasound in 1 year is recommended to ensure stability. Diverticulosis of descending colon without inflammation. Aortic Atherosclerosis (ICD10-I70.0). Electronically Signed   By: Lupita Raider M.D.   On: 05/29/2019 13:26   Derrel Nip, Kim 05/29/2019, 6:45 PM PGY-1, [facility] Family Medicine FPTS Intern pager: [phone], text pages welcome   FPTS Upper-Level Resident Addendum I have independently interviewed and examined the patient. I have discussed the above with the original author and agree with their documentation. My edits for correction/addition/clarification are in blue. Please see also any attending notes.    Peggyann Shoals, DO PGY-2, [facility] 05/29/2019 10:01 PM  FPTS Service pager: [phone] (text pages welcome through [facility])

## 2019-05-29 NOTE — ED Provider Notes (Signed)
[facility] EMERGENCY DEPARTMENT Provider Note   CSN: [id number] Arrival date & time: 05/29/19  6962     History   Chief Complaint Chief Complaint  Patient presents with  . Shortness of Breath  . Altered Mental Status    HPI Rosibel Giacobbe is a 83 y.o. female.     83 yo F with a chief complaints of right-sided pain and confusion.  The patient was noted to have had a bowel movement on herself this morning which is atypical for her.  Her daughter called the home health nurse who came and evaluated her and felt that she was breathing more rapidly than normal.  The daughter states that she has been breathing more rapidly but she feels that this is been an ongoing issue maybe it is worse this morning.  States that she has been having issues eating for some time, no obvious acute change.  Mostly has been complaining of right-sided abdominal pain.  No noted trauma.  Had one episode of emesis here in the ED though the daughter does not know if she had one at home or not.   The history is provided by the patient.  Shortness of Breath Associated symptoms: abdominal pain   Associated symptoms: no chest pain, no fever, no headaches, no vomiting and no wheezing   Altered Mental Status Associated symptoms: abdominal pain   Associated symptoms: no fever, no headaches, no nausea, no palpitations and no vomiting   Abdominal Pain Pain location:  RUQ Pain quality: aching   Pain radiates to:  Does not radiate Pain severity:  Moderate Onset quality:  Gradual Duration:  2 days Timing:  Constant Progression:  Worsening Chronicity:  New Relieved by:  Nothing Worsened by:  Nothing Ineffective treatments:  None tried Associated symptoms: shortness of breath   Associated symptoms: no chest pain, no chills, no dysuria, no fever, no nausea and no vomiting     Past Medical History:  Diagnosis Date  . Rheumatoid arthritis ([place])     There are no active problems to display  for this patient.   Past Surgical History:  Procedure Laterality Date  . CHOLECYSTECTOMY, LAPAROSCOPIC  2018   In [place].  Complicated.  Required drains and courses of antibiotics postoperatively     OB History   No obstetric history on file.      Home Medications    Prior to Admission medications   Not on File    Family History No family history on file.  Social History Social History   Tobacco Use  . Smoking status: Not on file  Substance Use Topics  . Alcohol use: Not on file  . Drug use: Not on file     Allergies   Patient has no known allergies.   Review of Systems Review of Systems  Constitutional: Negative for chills and fever.  HENT: Negative for congestion and rhinorrhea.   Eyes: Negative for redness and visual disturbance.  Respiratory: Positive for shortness of breath. Negative for wheezing.   Cardiovascular: Negative for chest pain and palpitations.  Gastrointestinal: Positive for abdominal pain. Negative for nausea and vomiting.  Genitourinary: Negative for dysuria and urgency.  Musculoskeletal: Negative for arthralgias and myalgias.  Skin: Negative for pallor and wound.  Neurological: Negative for dizziness and headaches.     Physical Exam Updated Vital Signs BP (!) 110/59 (BP Location: Right Arm)   Pulse 90   Temp 98.7 F (37.1 C) (Oral)   Resp 19   SpO2 96%  Physical Exam Vitals signs and nursing note reviewed.  Constitutional:      General: She is not in acute distress.    Appearance: She is well-developed. She is not diaphoretic.  HENT:     Head: Normocephalic and atraumatic.  Eyes:     Pupils: Pupils are equal, round, and reactive to light.  Neck:     Musculoskeletal: Normal range of motion and neck supple.  Cardiovascular:     Rate and Rhythm: Normal rate and regular rhythm.     Heart sounds: No murmur. No friction rub. No gallop.   Pulmonary:     Effort: Pulmonary effort is normal.     Breath sounds: No wheezing or  rales.  Abdominal:     General: There is no distension.     Palpations: Abdomen is soft.     Tenderness: There is abdominal tenderness (worst to the RUQ).  Musculoskeletal:        General: No tenderness.  Skin:    General: Skin is warm and dry.  Neurological:     Mental Status: She is alert and oriented to person, place, and time.  Psychiatric:        Behavior: Behavior normal.      ED Treatments / Results  Labs (all labs ordered are listed, but only abnormal results are displayed) Labs Reviewed  COMPREHENSIVE METABOLIC PANEL - Abnormal; Notable for the following components:      Result Value   CO2 21 (*)    Glucose, Bld 143 (*)    AST 325 (*)    ALT 195 (*)    Alkaline Phosphatase 284 (*)    Total Bilirubin 4.2 (*)    GFR calc non Af Amer 58 (*)    Anion gap 16 (*)    All other components within normal limits  CBC - Abnormal; Notable for the following components:   WBC 15.6 (*)    All other components within normal limits  URINALYSIS, ROUTINE W REFLEX MICROSCOPIC - Abnormal; Notable for the following components:   Ketones, ur 5 (*)    All other components within normal limits  LACTIC ACID, PLASMA - Abnormal; Notable for the following components:   Lactic Acid, Venous 2.2 (*)    All other components within normal limits  CBG MONITORING, ED - Abnormal; Notable for the following components:   Glucose-Capillary 147 (*)    All other components within normal limits  CULTURE, BLOOD (ROUTINE X 2)  CULTURE, BLOOD (ROUTINE X 2)  SARS CORONAVIRUS 2 (TAT 6-24 HRS)  LACTIC ACID, PLASMA    EKG EKG Interpretation  Date/Time:  Friday May 29 2019 09:37:23 EST Ventricular Rate:  116 PR Interval:  158 QRS Duration: 120 QT Interval:  350 QTC Calculation: 486 R Axis:   -10 Text Interpretation: Sinus tachycardia with Fusion complexes Right bundle branch block Minimal voltage criteria for LVH, may be normal variant ( R in aVL ) Inferior infarct , age undetermined Anterior  infarct , age undetermined T wave abnormality, consider lateral ischemia Abnormal ECG No old tracing to compare Confirmed by Melene PlanFloyd, Alaiya Martindelcampo [phone]([id number]) on 05/29/2019 11:16:26 AM   Radiology Dg Chest 2 View  Result Date: 05/29/2019 CLINICAL DATA:  Shortness of breath. EXAM: CHEST - 2 VIEW COMPARISON:  No pertinent prior studies available for comparison. FINDINGS: Evaluation of heart size is limited due to shallow inspiration. Aortic atherosclerosis. Shallow inspiration radiograph with crowding of the central bronchovascular markings. Bibasilar opacities (greater on the left) have an appearance most suggestive  of incomplete atelectasis. No evidence of pneumothorax or sizable pleural effusion. Multiple age-indeterminate compression deformities within the mid to lower thoracic and upper lumbar spine. Surgical clips project over the right upper quadrant of the abdomen. IMPRESSION: Shallow inspiration radiograph. Probable mild pulmonary vascular congestion. Ill-defined bibasilar opacities have an appearance most suggestive of incomplete atelectasis. Multiple age-indeterminate compression deformities within the mid to lower thoracic and upper lumbar spine. Electronically Signed   By: Jackey Loge DO   On: 05/29/2019 10:11   Ct Abdomen Pelvis W Contrast  Result Date: 05/29/2019 CLINICAL DATA:  Acute generalized abdominal pain. EXAM: CT ABDOMEN AND PELVIS WITH CONTRAST TECHNIQUE: Multidetector CT imaging of the abdomen and pelvis was performed using the standard protocol following bolus administration of intravenous contrast. CONTRAST:  80 mL OMNIPAQUE IOHEXOL 300 MG/ML  SOLN COMPARISON:  None. FINDINGS: Lower chest: No acute abnormality. Hepatobiliary: Status post cholecystectomy. Mild intrahepatic dilatation is noted with severely dilated common bile duct measuring 16 mm which may be due to post cholecystectomy status, but there does appear to be faint densities within the bile duct. No focal hepatic abnormality is  noted. Pancreas: Unremarkable. No pancreatic ductal dilatation or surrounding inflammatory changes. Spleen: Normal in size without focal abnormality. Adrenals/Urinary Tract: Adrenal glands are unremarkable. Kidneys are normal, without renal calculi, focal lesion, or hydronephrosis. Bladder is unremarkable. Stomach/Bowel: Stomach is within normal limits. Appendix appears normal. No evidence of bowel wall thickening, distention, or inflammatory changes. Diverticulosis of descending colon is noted without inflammation. Vascular/Lymphatic: Aortic atherosclerosis. No enlarged abdominal or pelvic lymph nodes. Reproductive: Uterus is unremarkable. No right adnexal abnormality is noted. 2.4 cm left ovarian cyst is noted. Other: No abdominal wall hernia or abnormality. No abdominopelvic ascites. Musculoskeletal: No acute or significant osseous findings. IMPRESSION: Mild intrahepatic biliary ductal dilatation is noted, with severely dilated common bile duct. This may simply be due to post cholecystectomy status, but there does appear to be faint densities within the dilated common bile duct which may represent stones. Further evaluation with MRCP is recommended. 2.4 cm left ovarian cyst is noted. This most likely is benign, but follow-up ultrasound in 1 year is recommended to ensure stability. Diverticulosis of descending colon without inflammation. Aortic Atherosclerosis (ICD10-I70.0). Electronically Signed   By: Lupita Raider M.D.   On: 05/29/2019 13:26    Procedures Procedures (including critical care time)  Medications Ordered in ED Medications  piperacillin-tazobactam (ZOSYN) IVPB 3.375 g (3.375 g Intravenous New Bag/Given 05/29/19 1546)  sodium chloride 0.9 % bolus 1,000 mL (1,000 mLs Intravenous New Bag/Given 05/29/19 1207)  ondansetron (ZOFRAN) injection 4 mg (4 mg Intravenous Given 05/29/19 1208)  iohexol (OMNIPAQUE) 300 MG/ML solution 100 mL (100 mLs Intravenous Contrast Given 05/29/19 1306)      Initial Impression / Assessment and Plan / ED Course  I have reviewed the triage vital signs and the nursing notes.  Pertinent labs & imaging results that were available during my care of the patient were reviewed by me and considered in my medical decision making (see chart for details).        83 yo F with a chief complaint of right-sided abdominal pain confusion.  I have no baseline lab work for her but she has LFT elevation and an elevated total bilirubin.  With her history of confusion this may be consistent with acute cholangitis.  She has a history of a gallbladder resection at least a couple years ago.  Will obtain a CT scan to further evaluate.  She does have  an anion gap, will add a lactate to the work-up.  Bolus of IV fluids nausea medicine pain medicine reassess.  Patient appears to be feeling better on reassessment.  CT scan is concerning for common bile duct dilatation with possible common bile duct stones.  Case was discussed with Jennye MoccasinSarah Gribbin, [facility] will come and evaluate the patient at bedside the recommended an MRCP.  Discussed with medicine for admission.  CRITICAL CARE Performed by: Rae Roamaniel Patrick Calder Oblinger   Total critical care time: 35 minutes  Critical care time was exclusive of separately billable procedures and treating other patients.  Critical care was necessary to treat or prevent imminent or life-threatening deterioration.  Critical care was time spent personally by me on the following activities: development of treatment plan with patient and/or surrogate as well as nursing, discussions with consultants, evaluation of patient's response to treatment, examination of patient, obtaining history from patient or surrogate, ordering and performing treatments and interventions, ordering and review of laboratory studies, ordering and review of radiographic studies, pulse oximetry and re-evaluation of patient's condition.   The patients results and plan  were reviewed and discussed.   Any x-rays performed were independently reviewed by myself.   Differential diagnosis were considered with the presenting HPI.  Medications  piperacillin-tazobactam (ZOSYN) IVPB 3.375 g (3.375 g Intravenous New Bag/Given 05/29/19 1546)  sodium chloride 0.9 % bolus 1,000 mL (1,000 mLs Intravenous New Bag/Given 05/29/19 1207)  ondansetron (ZOFRAN) injection 4 mg (4 mg Intravenous Given 05/29/19 1208)  iohexol (OMNIPAQUE) 300 MG/ML solution 100 mL (100 mLs Intravenous Contrast Given 05/29/19 1306)    Vitals:   05/29/19 0932 05/29/19 1130 05/29/19 1140 05/29/19 1548  BP: 122/82  (!) 118/50 (!) 110/59  Pulse: 97 (!) 105 (!) 112 90  Resp: 14 (!) 24 (!) 25 19  Temp: 98.7 F (37.1 C)     TempSrc: Oral     SpO2: 97% 94% 95% 96%    Final diagnoses:  Acute cholangitis    Admission/ observation were discussed with the admitting physician, patient and/or family and they are comfortable with the plan.    Final Clinical Impressions(s) / ED Diagnoses   Final diagnoses:  Acute cholangitis    ED Discharge Orders    None       Melene PlanFloyd, Joice Nazario, DO 05/29/19 1555

## 2019-05-30 ENCOUNTER — Inpatient Hospital Stay (HOSPITAL_COMMUNITY): Payer: Medicare PPO | Admitting: Anesthesiology

## 2019-05-30 ENCOUNTER — Encounter (HOSPITAL_COMMUNITY)
Admission: EM | Disposition: A | Discharge status: Home Health | Payer: Self-pay | Source: Home / Self Care | Attending: Family Medicine

## 2019-05-30 ENCOUNTER — Inpatient Hospital Stay (HOSPITAL_COMMUNITY): Payer: Medicare PPO

## 2019-05-30 ENCOUNTER — Encounter (HOSPITAL_COMMUNITY): Payer: Self-pay | Admitting: *Deleted

## 2019-05-30 HISTORY — PX: BILIARY STENT PLACEMENT: SHX5538

## 2019-05-30 HISTORY — PX: ENDOSCOPIC RETROGRADE CHOLANGIOPANCREATOGRAPHY (ERCP) WITH PROPOFOL: SHX5810

## 2019-05-30 HISTORY — PX: SPHINCTEROTOMY: SHX5544

## 2019-05-30 LAB — COMPREHENSIVE METABOLIC PANEL
ALT: 115 U/L — ABNORMAL HIGH (ref 0–44)
AST: 105 U/L — ABNORMAL HIGH (ref 15–41)
Albumin: 2.7 g/dL — ABNORMAL LOW (ref 3.5–5.0)
Alkaline Phosphatase: 191 U/L — ABNORMAL HIGH (ref 38–126)
Anion gap: 8 (ref 5–15)
BUN: 12 mg/dL (ref 8–23)
CO2: 25 mmol/L (ref 22–32)
Calcium: 7.9 mg/dL — ABNORMAL LOW (ref 8.9–10.3)
Chloride: 108 mmol/L (ref 98–111)
Creatinine, Ser: 0.9 mg/dL (ref 0.44–1.00)
GFR calc Af Amer: >60 mL/min (ref >60)
GFR calc non Af Amer: 60 mL/min — ABNORMAL LOW (ref >60)
Glucose, Bld: 113 mg/dL — ABNORMAL HIGH (ref 70–99)
Potassium: 3.2 mmol/L — ABNORMAL LOW (ref 3.5–5.1)
Sodium: 141 mmol/L (ref 135–145)
Total Bilirubin: 2.3 mg/dL — ABNORMAL HIGH (ref 0.3–1.2)
Total Protein: 5.2 g/dL — ABNORMAL LOW (ref 6.5–8.1)

## 2019-05-30 LAB — CBC
HCT: 36.2 % (ref 36.0–46.0)
Hemoglobin: 11.5 g/dL — ABNORMAL LOW (ref 12.0–15.0)
MCH: 31.6 pg (ref 26.0–34.0)
MCHC: 31.8 g/dL (ref 30.0–36.0)
MCV: 99.5 fL (ref 80.0–100.0)
Platelets: 154 10*3/uL (ref 150–400)
RBC: 3.64 MIL/uL — ABNORMAL LOW (ref 3.87–5.11)
RDW: 15.2 % (ref 11.5–15.5)
WBC: 8.2 10*3/uL (ref 4.0–10.5)
nRBC: 0 % (ref 0.0–0.2)

## 2019-05-30 LAB — PROTIME-INR
INR: 1.2 (ref 0.8–1.2)
Prothrombin Time: 14.6 seconds (ref 11.4–15.2)

## 2019-05-30 SURGERY — ENDOSCOPIC RETROGRADE CHOLANGIOPANCREATOGRAPHY (ERCP) WITH PROPOFOL
Anesthesia: General

## 2019-05-30 MED ORDER — ONDANSETRON HCL 4 MG/2ML IJ SOLN
INTRAMUSCULAR | Status: DC | PRN
  Administered 2019-05-30: 4 mg via INTRAVENOUS

## 2019-05-30 MED ORDER — LACTATED RINGERS IV SOLN
INTRAVENOUS | Status: DC
  Administered 2019-05-30: 10:00:00 via INTRAVENOUS

## 2019-05-30 MED ORDER — PHENYLEPHRINE 40 MCG/ML (10ML) SYRINGE FOR IV PUSH (FOR BLOOD PRESSURE SUPPORT)
PREFILLED_SYRINGE | INTRAVENOUS | Status: DC | PRN
  Administered 2019-05-30: 120 ug via INTRAVENOUS
  Administered 2019-05-30: 160 ug via INTRAVENOUS

## 2019-05-30 MED ORDER — GABAPENTIN 300 MG PO CAPS
300.0000 mg | ORAL_CAPSULE | Freq: Three times a day (TID) | ORAL | Status: DC
  Administered 2019-05-30 – 2019-06-01 (×6): 300 mg via ORAL
  Filled 2019-05-30 (×6): qty 1

## 2019-05-30 MED ORDER — PIPERACILLIN-TAZOBACTAM 3.375 G IVPB
3.3750 g | Freq: Three times a day (TID) | INTRAVENOUS | Status: DC
  Administered 2019-05-30 – 2019-05-31 (×5): 3.375 g via INTRAVENOUS
  Filled 2019-05-30 (×5): qty 50

## 2019-05-30 MED ORDER — ACETAMINOPHEN 325 MG PO TABS
650.0000 mg | ORAL_TABLET | Freq: Four times a day (QID) | ORAL | Status: DC | PRN
  Administered 2019-06-01: 650 mg via ORAL
  Filled 2019-05-30: qty 2

## 2019-05-30 MED ORDER — DEXAMETHASONE SODIUM PHOSPHATE 10 MG/ML IJ SOLN
INTRAMUSCULAR | Status: DC | PRN
  Administered 2019-05-30: 10 mg via INTRAVENOUS

## 2019-05-30 MED ORDER — INDOMETHACIN 50 MG RE SUPP: 100.0000 mg | Freq: Once | RECTAL | Status: AC

## 2019-05-30 MED ORDER — ACETAMINOPHEN 650 MG RE SUPP: 650.0000 mg | Freq: Four times a day (QID) | RECTAL | Status: DC

## 2019-05-30 MED ORDER — LIDOCAINE 2% (20 MG/ML) 5 ML SYRINGE
INTRAMUSCULAR | Status: DC | PRN
  Administered 2019-05-30: 40 mg via INTRAVENOUS

## 2019-05-30 MED ORDER — INDOMETHACIN 50 MG RE SUPP
RECTAL | Status: DC | PRN
  Administered 2019-05-30: 100 mg via RECTAL

## 2019-05-30 MED ORDER — PIPERACILLIN-TAZOBACTAM 3.375 G IVPB 30 MIN: 3.3750 g | Freq: Once | INTRAVENOUS | Status: DC

## 2019-05-30 MED ORDER — SODIUM CHLORIDE 0.9 % IV SOLN
INTRAVENOUS | Status: DC | PRN
  Administered 2019-05-30: 30 mL

## 2019-05-30 MED ORDER — ROCURONIUM BROMIDE 50 MG/5ML IV SOSY
PREFILLED_SYRINGE | INTRAVENOUS | Status: DC | PRN
  Administered 2019-05-30: 50 mg via INTRAVENOUS

## 2019-05-30 MED ORDER — FENTANYL CITRATE (PF) 100 MCG/2ML IJ SOLN
INTRAMUSCULAR | Status: DC | PRN
  Administered 2019-05-30 (×2): 50 ug via INTRAVENOUS

## 2019-05-30 MED ORDER — ACETAMINOPHEN 325 MG PO TABS: 650.0000 mg | ORAL_TABLET | Freq: Four times a day (QID) | ORAL | Status: DC

## 2019-05-30 MED ORDER — PANTOPRAZOLE SODIUM 40 MG PO TBEC
40.0000 mg | DELAYED_RELEASE_TABLET | Freq: Every day | ORAL | Status: DC
  Administered 2019-05-30 – 2019-06-01 (×3): 40 mg via ORAL
  Filled 2019-05-30 (×3): qty 1

## 2019-05-30 MED ORDER — PROPOFOL 10 MG/ML IV BOLUS
INTRAVENOUS | Status: DC | PRN
  Administered 2019-05-30: 140 mg via INTRAVENOUS

## 2019-05-30 MED ORDER — POTASSIUM CHLORIDE CRYS ER 20 MEQ PO TBCR
40.0000 meq | EXTENDED_RELEASE_TABLET | Freq: Once | ORAL | Status: AC
  Administered 2019-05-30: 40 meq via ORAL
  Filled 2019-05-30: qty 2

## 2019-05-30 MED ORDER — SUGAMMADEX SODIUM 200 MG/2ML IV SOLN
INTRAVENOUS | Status: DC | PRN
  Administered 2019-05-30: 110 mg via INTRAVENOUS

## 2019-05-30 MED ORDER — LIDOCAINE 5 % EX PTCH
1.0000 | MEDICATED_PATCH | Freq: Every day | CUTANEOUS | Status: DC
  Administered 2019-05-30 – 2019-05-31 (×3): 1 via TRANSDERMAL
  Filled 2019-05-30 (×3): qty 1

## 2019-05-30 MED ORDER — SODIUM CHLORIDE 0.9 % IV BOLUS
1000.0000 mL | Freq: Once | INTRAVENOUS | Status: AC
  Administered 2019-05-30: 1000 mL via INTRAVENOUS

## 2019-05-30 MED ORDER — ACETAMINOPHEN 650 MG RE SUPP: 650.0000 mg | Freq: Four times a day (QID) | RECTAL | Status: DC | PRN

## 2019-05-30 MED ORDER — INDOMETHACIN 50 MG RE SUPP
RECTAL | Status: AC
  Filled 2019-05-30: qty 2

## 2019-05-30 MED ORDER — SODIUM CHLORIDE 0.9 % IV SOLN
1.5000 g | Freq: Once | INTRAVENOUS | Status: AC
  Filled 2019-05-30: qty 4

## 2019-05-30 NOTE — Progress Notes (Signed)
Family Medicine Teaching Service Daily Progress Note Intern Pager: [phone]  Patient name: Theresa Kim Medical record number: [id number] Date of birth: 11-May-1936 Age: 83 y.o. Gender: female  Primary Care Provider: Annita Brod, MD Consultants: GI  Code Status: FULL  Pt Overview and Major Events to Date:  05/29/19:  Admitted, GI consulted  05/30/19: ERCP  Assessment and Plan:  Javonda Suh is a 83 y.o. female presenting with shortness of breath and RUQ abdominal pain. PMHx significant for RA, osteoporosis, HTN and GERD    Choledocholithiasis Patient hypotensive this morning.  Will give 1 L NS bolus. Patient presented with RUQ abdominal pain and nausea.  Patient with history of cholecystectomy in 2018.  Transaminases have down-trended. CT ABD/Pelvis indicated hepatic biliary ductal dilation with severely dilated common bile duct.  GI recommended patient have ERCP today.  -GI following, appreciate recommendations  -AM CBC, CMP -PT/INR -Vitals per routine -NPO, advance diet per GI -Tylenol scheduled  -IV fluids D5/LR @ 151mL/hr -Zofran PRN -Follow up BCx  -PT/OT eval and treat -Continue Zosyn (11/20 - )  Shortness of breath On exam, diffuse rales auscultated in the lungs.  Etiology of shortness of breath could be due to rib fractures, underlying interstitial lung disease and/or pneumonia.  Patient given Zosyn in the ED.  Patient complaining of right-sided flank pain could be due to possible rib fractures. Chest x-ray formed in the ED was indeterminate as patient had shallow inspiration.  Consider repeating chest x-ray and obtaining right rib series.  On admission patient reported urinary pop or click in her right rib cage has had pain ever since. Leukocytosis seen on admission has resolved (WBC 8.2).  Lactic acid down trended to 1.9 from 2.2. -Follow up R rib series  -Continue Zosyn (11/20 - )  -Continuous pulse oximetry   Rheumatoid arthritis Home medications  include prednisone 5mg  daily, methotrexate and gabapentin. She has been on steroids for several months.   -Continue home medication  GERD Patient has issues with reflux and takes PPI approximately 3 times weekly. She is supposed to take the medicine daily -Continue home medication  Osteoporosis T and L-spine imaging indicate age-indeterminate T5, T7, T11, and L3 compression fractures as well as osteoporosis.  Left forearm imaging indicated old healed fractures of the distal left radius and ulna. Patient's last DEXA scan in October 2020 showed T-score of 5.1. The patient also has a history of fractures to her coccyx and left wrist.  -Schedule Tylenol 650 mg every 6 hours - Recommend PCP consider tapering prednisone, Start treatment for osteoporosis outpatient - Pepcid versus PPI   FEN/GI: NPO, replete electrolytes as needed  PPx: SCDs  Disposition:   Subjective:  Theresa Kim had no significant overnight events.    Objective: Temp:  [97.9 F (36.6 C)-98.8 F (37.1 C)] 97.9 F (36.6 C) (11/21 0528) Pulse Rate:  [63-112] 89 (11/21 0528) Resp:  [14-25] 20 (11/20 1957) BP: (87-122)/(43-82) 87/75 (11/21 0528) SpO2:  [94 %-97 %] 94 % (11/21 0528) Weight:  [50.2 kg] 50.2 kg (11/20 2009)  Physical Exam:  General: well developed elderly female, resting comfortably in bed Cardiovascular: regular rate and rhythm, distal pulses intact  Respiratory: basilar rales on the left, no increased work of breathing  Abdomen: soft, RUQ tenderness, non-distended,  Extremities: no lower extremity edema   Laboratory: Recent Labs  Lab 05/29/19 0947 05/30/19 0314  WBC 15.6* 8.2  HGB 14.4 11.5*  HCT 43.7 36.2  PLT 180 154   Recent Labs  Lab 05/29/19 0947  05/30/19 0314  NA 139 141  K 3.5 3.2*  CL 102 108  CO2 21* 25  BUN 19 12  CREATININE 0.93 0.90  CALCIUM 8.9 7.9*  PROT 6.7 5.2*  BILITOT 4.2* 2.3*  ALKPHOS 284* 191*  ALT 195* 115*  AST 325* 105*  GLUCOSE 143* 113*       Imaging/Diagnostic Tests: Dg Chest 2 View  Result Date: 05/29/2019 CLINICAL DATA:  Shortness of breath. EXAM: CHEST - 2 VIEW COMPARISON:  No pertinent prior studies available for comparison. FINDINGS: Evaluation of heart size is limited due to shallow inspiration. Aortic atherosclerosis. Shallow inspiration radiograph with crowding of the central bronchovascular markings. Bibasilar opacities (greater on the left) have an appearance most suggestive of incomplete atelectasis. No evidence of pneumothorax or sizable pleural effusion. Multiple age-indeterminate compression deformities within the mid to lower thoracic and upper lumbar spine. Surgical clips project over the right upper quadrant of the abdomen. IMPRESSION: Shallow inspiration radiograph. Probable mild pulmonary vascular congestion. Ill-defined bibasilar opacities have an appearance most suggestive of incomplete atelectasis. Multiple age-indeterminate compression deformities within the mid to lower thoracic and upper lumbar spine. Electronically Signed   By: Jackey LogeKyle  Golden DO   On: 05/29/2019 10:11   Dg Thoracic Spine 2 View  Result Date: 05/29/2019 CLINICAL DATA:  Compression fracture of vertebra. EXAM: THORACIC SPINE 2 VIEWS COMPARISON:  Reformats from abdominal CT earlier this day. Chest radiograph earlier this day. No prior imaging available. FINDINGS: Mild to moderate compression fracture of T11 vertebra, numbering based on prior abdominopelvic CT. Probable mild T5 and possibly T7 compression deformity. Bones are diffusely under mineralized limiting assessment. No obvious paravertebral soft tissue abnormality. IMPRESSION: 1. Mild to moderate T11 compression fracture, age indeterminate. 2. Probable mild compression fractures of T5 and T7. Please note evaluation is limited due to underlying osteopenia/osteoporosis. Electronically Signed   By: Narda RutherfordMelanie  Sanford M.D.   On: 05/29/2019 23:35   Dg Lumbar Spine 2-3 Views  Result Date:  05/29/2019 CLINICAL DATA:  Compression fracture vertebra. EXAM: LUMBAR SPINE - 2-3 VIEW COMPARISON:  Reformats from abdominopelvic CT earlier this day. FINDINGS: Mild L3 superior endplate compression fracture with Schmorl's node. Mild to moderate T11 compression fracture. Remaining lumbar vertebral body heights are preserved. Underlying osteopenia/osteoporosis limits radiographic evaluation. Mild scoliotic curvature and multilevel degenerative change. Excreted IV contrast within the urinary bladder from prior CT. IMPRESSION: Mild L3 superior endplate compression fracture with Schmorl's node. Mild to moderate T11 compression fracture. Evaluation is limited due to underlying osteopenia/osteoporosis. Electronically Signed   By: Narda RutherfordMelanie  Sanford M.D.   On: 05/29/2019 23:36   Dg Forearm Left  Result Date: 05/29/2019 CLINICAL DATA:  Dental bone fixation hardware in place. EXAM: LEFT FOREARM - 2 VIEW COMPARISON:  None. FINDINGS: There is a plate and screws along the volar aspect of the distal left radius. The fracture of the distal radius has completely healed. There is an old healed fracture of the distal ulna with secondary deformity. There is diffuse osteopenia at the wrist. No acute abnormality. IMPRESSION: No acute abnormality. Old healed fractures of the distal left radius and ulna. Metal plate and screws along the volar aspect of the distal left radius. Electronically Signed   By: Francene BoyersJames  Maxwell M.D.   On: 05/29/2019 17:10   Ct Abdomen Pelvis W Contrast  Result Date: 05/29/2019 CLINICAL DATA:  Acute generalized abdominal pain. EXAM: CT ABDOMEN AND PELVIS WITH CONTRAST TECHNIQUE: Multidetector CT imaging of the abdomen and pelvis was performed using the standard protocol following bolus  administration of intravenous contrast. CONTRAST:  80 mL OMNIPAQUE IOHEXOL 300 MG/ML  SOLN COMPARISON:  None. FINDINGS: Lower chest: No acute abnormality. Hepatobiliary: Status post cholecystectomy. Mild intrahepatic  dilatation is noted with severely dilated common bile duct measuring 16 mm which may be due to post cholecystectomy status, but there does appear to be faint densities within the bile duct. No focal hepatic abnormality is noted. Pancreas: Unremarkable. No pancreatic ductal dilatation or surrounding inflammatory changes. Spleen: Normal in size without focal abnormality. Adrenals/Urinary Tract: Adrenal glands are unremarkable. Kidneys are normal, without renal calculi, focal lesion, or hydronephrosis. Bladder is unremarkable. Stomach/Bowel: Stomach is within normal limits. Appendix appears normal. No evidence of bowel wall thickening, distention, or inflammatory changes. Diverticulosis of descending colon is noted without inflammation. Vascular/Lymphatic: Aortic atherosclerosis. No enlarged abdominal or pelvic lymph nodes. Reproductive: Uterus is unremarkable. No right adnexal abnormality is noted. 2.4 cm left ovarian cyst is noted. Other: No abdominal wall hernia or abnormality. No abdominopelvic ascites. Musculoskeletal: No acute or significant osseous findings. IMPRESSION: Mild intrahepatic biliary ductal dilatation is noted, with severely dilated common bile duct. This may simply be due to post cholecystectomy status, but there does appear to be faint densities within the dilated common bile duct which may represent stones. Further evaluation with MRCP is recommended. 2.4 cm left ovarian cyst is noted. This most likely is benign, but follow-up ultrasound in 1 year is recommended to ensure stability. Diverticulosis of descending colon without inflammation. Aortic Atherosclerosis (ICD10-I70.0). Electronically Signed   By: Marijo Conception M.D.   On: 05/29/2019 13:26     Lyndee Hensen, MD 05/30/2019, 8:49 AM PGY-1, [facility] Intern pager: [phone], text pages welcome

## 2019-05-30 NOTE — Transfer of Care (Signed)
Immediate Anesthesia Transfer of Care Note  Patient: Theresa Kim  Procedure(s) Performed: ENDOSCOPIC RETROGRADE CHOLANGIOPANCREATOGRAPHY (ERCP) WITH PROPOFOL (N/A ) SPHINCTEROTOMY BILIARY STENT PLACEMENT  Patient Location: Endoscopy Unit  Anesthesia Type:General  Level of Consciousness: drowsy and patient cooperative  Airway & Oxygen Therapy: Patient Spontanous Breathing  Post-op Assessment: Report given to RN and Post -op Vital signs reviewed and stable  Post vital signs: Reviewed and stable  Last Vitals:  Vitals Value Taken Time  BP 149/57 05/30/19 1113  Temp 36.5 C 05/30/19 1113  Pulse 71 05/30/19 1114  Resp 18 05/30/19 1114  SpO2 94 % 05/30/19 1114  Vitals shown include unvalidated device data.  Last Pain:  Vitals:   05/30/19 1113  TempSrc: Temporal  PainSc: Asleep         Complications: No apparent anesthesia complications

## 2019-05-30 NOTE — Anesthesia Preprocedure Evaluation (Signed)
Anesthesia Evaluation  Patient identified by MRN, date of birth, ID band Patient awake    Reviewed: Allergy & Precautions, NPO status , Patient's Chart, lab work & pertinent test results  Airway Mallampati: II  TM Distance: >3 FB Neck ROM: Full    Dental  (+) Poor Dentition, Dental Advisory Given   Pulmonary    breath sounds clear to auscultation + decreased breath sounds      Cardiovascular  Rhythm:Regular Rate:Normal     Neuro/Psych    GI/Hepatic   Endo/Other    Renal/GU      Musculoskeletal   Abdominal   Peds  Hematology   Anesthesia Other Findings   Reproductive/Obstetrics                             Anesthesia Physical Anesthesia Plan  ASA: III  Anesthesia Plan: General   Post-op Pain Management:    Induction: Intravenous  PONV Risk Score and Plan: Ondansetron and Dexamethasone  Airway Management Planned: Oral ETT  Additional Equipment:   Intra-op Plan:   Post-operative Plan:   Informed Consent: I have reviewed the patients History and Physical, chart, labs and discussed the procedure including the risks, benefits and alternatives for the proposed anesthesia with the patient or authorized representative who has indicated his/her understanding and acceptance.     Dental advisory given  Plan Discussed with: CRNA and Anesthesiologist  Anesthesia Plan Comments:         Anesthesia Quick Evaluation

## 2019-05-30 NOTE — Anesthesia Procedure Notes (Signed)
Procedure Name: Intubation Date/Time: 05/30/2019 10:04 AM Performed by: Lance Coon, CRNA Pre-anesthesia Checklist: Patient identified, Emergency Drugs available, Suction available, Patient being monitored and Timeout performed Patient Re-evaluated:Patient Re-evaluated prior to induction Oxygen Delivery Method: Circle system utilized Preoxygenation: Pre-oxygenation with 100% oxygen Induction Type: IV induction Ventilation: Mask ventilation without difficulty Laryngoscope Size: Miller and 2 Grade View: Grade I Tube type: Oral Tube size: 7.0 mm Number of attempts: 1 Airway Equipment and Method: Stylet Placement Confirmation: ETT inserted through vocal cords under direct vision,  positive ETCO2 and breath sounds checked- equal and bilateral Secured at: 20 cm Tube secured with: Tape Dental Injury: Teeth and Oropharynx as per pre-operative assessment

## 2019-05-30 NOTE — Anesthesia Postprocedure Evaluation (Signed)
Anesthesia Post Note  Patient: Theresa Kim, Theresa Kim  Procedure(s) Performed: ENDOSCOPIC RETROGRADE CHOLANGIOPANCREATOGRAPHY (ERCP) WITH PROPOFOL (N/A ) SPHINCTEROTOMY BILIARY STENT PLACEMENT     Patient location during evaluation: Endoscopy Anesthesia Type: General Level of consciousness: awake and alert Pain management: pain level controlled Vital Signs Assessment: post-procedure vital signs reviewed and stable Respiratory status: spontaneous breathing, nonlabored ventilation, respiratory function stable and patient connected to nasal cannula oxygen Cardiovascular status: blood pressure returned to baseline and stable Postop Assessment: no apparent nausea or vomiting Anesthetic complications: no    Last Vitals:  Vitals:   05/30/19 1410 05/30/19 1607  BP: (!) 146/56 115/86  Pulse: 69 (!) 55  Resp: 16 14  Temp: (!) 36.3 C 36.5 C  SpO2: 96% 96%    Last Pain:  Vitals:   05/30/19 1607  TempSrc: Oral  PainSc:                  Chrystian Cupples COKER

## 2019-05-30 NOTE — Progress Notes (Signed)
Pt changed to yellow MEWS. 91% on RA, O2 @ 2LNC applied. 96 % on 2L [place]. Resp 24 and shallow. Responds appropriately to voice, but quickly returns to sleep. MD notified. Pt sedated s/p endo. Will monitor per MEWS protocol.   MEWS Guidelines - (patients age 81 and over)  Red - At High Risk for Deterioration Yellow - At risk for Deterioration  1. Go to room and assess patient 2. Validate data. Is this patient's baseline? If data confirmed: 3. Is this an acute change? 4. Administer prn meds/treatments as ordered. 5. Note Sepsis score 6. Review goals of care 7. Sports coach, RRT nurse and Provider. 8. Ask Provider to come to bedside.  9. Document patient condition/interventions/response. 10. Increase frequency of vital signs and focused assessments to at least q15 minutes x 4, then q30 minutes x2. - If stable, then q1h x3, then q4h x3 and then q8h or dept. routine. - If unstable, contact Provider & RRT nurse. Prepare for possible transfer. 11. Add entry in progress notes using the smart phrase ".MEWS". 1. Go to room and assess patient 2. Validate data. Is this patient's baseline? If data confirmed: 3. Is this an acute change? 4. Administer prn meds/treatments as ordered? 5. Note Sepsis score 6. Review goals of care 7. Sports coach and Provider 8. Call RRT nurse as needed. 9. Document patient condition/interventions/response. 10. Increase frequency of vital signs and focused assessments to at least q2h x2. - If stable, then q4h x2 and then q8h or dept. routine. - If unstable, contact Provider & RRT nurse. Prepare for possible transfer. 11. Add entry in progress notes using the smart phrase ".MEWS".  Green - Likely stable Lavender - Comfort Care Only  1. Continue routine/ordered monitoring.  2. Review goals of care. 1. Continue routine/ordered monitoring. 2. Review goals of care.

## 2019-05-30 NOTE — Op Note (Signed)
ERCP was performed for right sided upper abdominal pain, biliary dilation on CAT scan, elevated liver enzymes and suspected cholangitis.  Findings: Initial CBD cannulation was challenging, after a few attempts and repositioning the patient deep cannulation was achieved.  The common bile duct appeared significantly dilated between 18 to 25 mm in size.  Cholangiogram revealed at least 3 large CBD stones measuring between 1.5 to 2.5 cm.  A 9 mm sphincterotomy was performed, balloon sweep with a 12 mm balloon was performed, there was no evidence of pus.  A 7 French 7 cm plastic stent was left in the common bile duct with good flow of bile noted through the stent.  The pancreatic duct was never injected or cannulated during the entire procedure. A small diverticulum was noted in third portion of the duodenum.  Recommendations: Clear liquid diet for now, continue IV antibiotics.  Patient will need repeat ERCP with spyglass and lithotripsy for removal of large CBD stones and for removal of plastic CBD stent.  This will be arranged by Dr. Loletha Carrow with [facility].  Ronnette Juniper, MD

## 2019-05-30 NOTE — Op Note (Signed)
[facility] Patient Name: Theresa Kim Procedure Date : 05/30/2019 MRN: [id number] Attending MD: Kerin SalenArya Lela Murfin , MD Date of Birth: 07/06/1936 CSN: [id number] Age: [age] Admit Type: Inpatient Procedure:                ERCP Indications:              Abdominal pain in the right upper quadrant, Biliary                            dilation on Computed Tomogram Scan, Suspected                            ascending cholangitis, Elevated liver enzymes Providers:                Kerin SalenArya Tabia Landowski, MD, Norman ClayLisa Nunn, RN, Lawson Radararlene Davis,                            Technician, Rosiland OzBrandon Meyers, CRNA Referring MD:             Amada JupiterHenry Danis, MD Medicines:                Monitored Anesthesia Care Complications:            No immediate complications. Estimated blood loss:                            Minimal. Estimated Blood Loss:     Estimated blood loss was minimal. Procedure:                Pre-Anesthesia Assessment:                           - Prior to the procedure, a History and Physical                            was performed, and patient medications and                            allergies were reviewed. The patient's tolerance of                            previous anesthesia was also reviewed. The risks                            and benefits of the procedure and the sedation                            options and risks were discussed with the patient.                            All questions were answered, and informed consent                            was obtained. Prior Anticoagulants: The patient has  taken no previous anticoagulant or antiplatelet                            agents. ASA Grade Assessment: III - A patient with                            severe systemic disease. After reviewing the risks                            and benefits, the patient was deemed in                            satisfactory condition to undergo the procedure.   After obtaining informed consent, the scope was                            passed under direct vision. Throughout the                            procedure, the patient's blood pressure, pulse, and                            oxygen saturations were monitored continuously. The                            Duodenoscope was introduced through the mouth, and                            used to inject contrast into and used to inject                            contrast into the bile duct. The ERCP was                            accomplished without difficulty. The patient                            tolerated the procedure well. Scope In: Scope Out: Findings:      The scout film was normal. The esophagus was successfully intubated       under direct vision. The scope was advanced to a normal major papilla in       the descending duodenum without detailed examination of the pharynx,       larynx and associated structures, and upper GI tract. The upper GI tract       was grossly normal. A small diverticulum was noted in the third portion       of the duodenum.      Initial canulation was challenging as the ampulla appeared to be       downward facing and the patient had to be repositioned. After a few       attemtps, the bile duct was deeply cannulated with the sphincterotome.      Contrast was injected. I personally interpreted the bile duct images.       There was brisk flow of contrast through the ducts. Image quality  was       excellent. Contrast extended to the entire biliary tree.      The main bile duct was markedly dilated to a diameter was 18-25 mm. A       cholecystectomy had been performed. A straight Roadrunner wire was       passed into the biliary tree.      A 9 mm biliary sphincterotomy was made with a braided sphincterotome       using ERBE electrocautery. The sphincterotomy oozed minimal amount of       blood which resolved spontaneously.      On cholangiogram, atleast 3 large  stones were noted in mid and distal       CBD ranging in size between 1.5 to 2.5 cm.      To discover objects, the biliary tree was swept with a 12 mm balloon       starting at the bifurcation. No stones were removed. There was no pus       noted. Three large stones remained.      One 7 Fr by 7 cm plastic stent with a single external flap and a single       internal flap was placed 6.5 cm into the common bile duct.      Bile flowed through the stent. The stent was in good position.      The pancreatic duct was not canulated or injected duirng the procedure. Impression:               - The entire main bile duct was markedly dilated.                           - The patient has had a cholecystectomy.                           - Choledocholithiasis found. Three large CBD stones                            between 1.5 to 2.5 cm in size, they were not                            removed, but a biliary sphincterotomy was performed                            and a stent was inserted.                           - The biliary tree was swept. No pus noted.                           - One plastic stent was placed into the common bile                            duct. Moderate Sedation:      Patient did not receive moderate sedation for this procedure, but       instead received monitored anesthesia care. Recommendation:           - Repeat ERCP with [facility](Dr.Danis will make  arrangements) at appointment to be scheduled as                            patient will likely need spyglass and lithotripsy                            and removal of stent .                           - Clear liquid diet.                           - Continue present medications. Procedure Code(s):        --- Professional ---                           [id number], Endoscopic retrograde                            cholangiopancreatography (ERCP); with placement of                            endoscopic stent  into biliary or pancreatic duct,                            including pre- and post-dilation and guide wire                            passage, when performed, including sphincterotomy,                            when performed, each stent Diagnosis Code(s):        --- Professional ---                           K83.8, Other specified diseases of biliary tract                           Z90.49, Acquired absence of other specified parts                            of digestive tract                           K80.50, Calculus of bile duct without cholangitis                            or cholecystitis without obstruction                           R10.11, Right upper quadrant pain                           R74.8, Abnormal levels of other serum enzymes CPT copyright 2019 American Medical Association. All rights reserved. The codes documented in this report are preliminary and upon coder review may  be revised to  meet current compliance requirements. Kerin Salen, MD 05/30/2019 11:11:02 AM This report has been signed electronically. Number of Addenda: 0

## 2019-05-30 NOTE — Interval H&P Note (Signed)
History and Physical Interval Note: 83/female with RUQ pain, abnormal LFTs and dilated CBD with faint densities noted in CBD- possible choledocholithiasis and suspected cholangitis for an ERCP with sphincterotomy and possible stone extraction.  05/30/2019 9:26 AM  Theresa Kim  has presented today for ERCP, with the diagnosis of cholangitis possible choledocholithiasis.  The various methods of treatment have been discussed with the patient and family. After consideration of risks, benefits and other options for treatment, the patient has consented to  Procedure(s): ENDOSCOPIC RETROGRADE CHOLANGIOPANCREATOGRAPHY (ERCP) WITH PROPOFOL (N/A) SPHINCTEROTOMY as a surgical intervention.  The patient's history has been reviewed, patient examined, no change in status, stable for surgery.  I have reviewed the patient's chart and labs.  Questions were answered to the patient's satisfaction.     Ronnette Juniper

## 2019-05-30 NOTE — Brief Op Note (Signed)
05/29/2019 - 05/30/2019  11:11 AM  PATIENT:  Theresa Kim  83 y.o. female  PRE-OPERATIVE DIAGNOSIS:  choledocholithiasis  POST-OPERATIVE DIAGNOSIS:  CBD stones  PROCEDURE:  Procedure(s): ENDOSCOPIC RETROGRADE CHOLANGIOPANCREATOGRAPHY (ERCP) WITH PROPOFOL (N/A) SPHINCTEROTOMY BILIARY STENT PLACEMENT  SURGEON:  Surgeon(s) and Role:    Ronnette Juniper, MD - Primary  PHYSICIAN ASSISTANT:   ASSISTANTS:Lisa Marissa Calamity, RN, Laverda Sorenson, Tech  ANESTHESIA:   MAC  EBL:  Minimal  BLOOD ADMINISTERED:none  DRAINS: none   LOCAL MEDICATIONS USED:  NONE  SPECIMEN:  No Specimen  DISPOSITION OF SPECIMEN:  N/A  COUNTS:  YES  TOURNIQUET:  * No tourniquets in log *  DICTATION: .Dragon Dictation  PLAN OF CARE: Admit to inpatient   PATIENT DISPOSITION:  PACU - hemodynamically stable.   Delay start of Pharmacological VTE agent (>24hrs) due to surgical blood loss or risk of bleeding: not applicable

## 2019-05-31 ENCOUNTER — Encounter (HOSPITAL_COMMUNITY): Payer: Self-pay | Admitting: Gastroenterology

## 2019-05-31 DIAGNOSIS — K8309 Other cholangitis: Secondary | ICD-10-CM

## 2019-05-31 LAB — CBC WITH DIFFERENTIAL/PLATELET
Abs Immature Granulocytes: 0.04 10*3/uL (ref 0.00–0.07)
Basophils Absolute: 0 10*3/uL (ref 0.0–0.1)
Basophils Relative: 0 %
Eosinophils Absolute: 0 10*3/uL (ref 0.0–0.5)
Eosinophils Relative: 0 %
HCT: 36 % (ref 36.0–46.0)
Hemoglobin: 11.5 g/dL — ABNORMAL LOW (ref 12.0–15.0)
Immature Granulocytes: 1 %
Lymphocytes Relative: 10 %
Lymphs Abs: 0.7 10*3/uL (ref 0.7–4.0)
MCH: 31.8 pg (ref 26.0–34.0)
MCHC: 31.9 g/dL (ref 30.0–36.0)
MCV: 99.4 fL (ref 80.0–100.0)
Monocytes Absolute: 0.1 10*3/uL (ref 0.1–1.0)
Monocytes Relative: 2 %
Neutro Abs: 5.9 10*3/uL (ref 1.7–7.7)
Neutrophils Relative %: 87 %
Platelets: 136 10*3/uL — ABNORMAL LOW (ref 150–400)
RBC: 3.62 MIL/uL — ABNORMAL LOW (ref 3.87–5.11)
RDW: 14.8 % (ref 11.5–15.5)
WBC: 6.8 10*3/uL (ref 4.0–10.5)
nRBC: 0 % (ref 0.0–0.2)

## 2019-05-31 LAB — COMPREHENSIVE METABOLIC PANEL
ALT: 74 U/L — ABNORMAL HIGH (ref 0–44)
AST: 50 U/L — ABNORMAL HIGH (ref 15–41)
Albumin: 2.4 g/dL — ABNORMAL LOW (ref 3.5–5.0)
Alkaline Phosphatase: 153 U/L — ABNORMAL HIGH (ref 38–126)
Anion gap: 7 (ref 5–15)
BUN: 9 mg/dL (ref 8–23)
CO2: 24 mmol/L (ref 22–32)
Calcium: 7.7 mg/dL — ABNORMAL LOW (ref 8.9–10.3)
Chloride: 112 mmol/L — ABNORMAL HIGH (ref 98–111)
Creatinine, Ser: 0.86 mg/dL (ref 0.44–1.00)
GFR calc Af Amer: >60 mL/min (ref >60)
GFR calc non Af Amer: >60 mL/min (ref >60)
Glucose, Bld: 148 mg/dL — ABNORMAL HIGH (ref 70–99)
Potassium: 4.7 mmol/L (ref 3.5–5.1)
Sodium: 143 mmol/L (ref 135–145)
Total Bilirubin: 0.9 mg/dL (ref 0.3–1.2)
Total Protein: 5.3 g/dL — ABNORMAL LOW (ref 6.5–8.1)

## 2019-05-31 MED ORDER — CIPROFLOXACIN HCL 500 MG PO TABS
500.0000 mg | ORAL_TABLET | Freq: Two times a day (BID) | ORAL | Status: DC
  Administered 2019-05-31: 500 mg via ORAL
  Filled 2019-05-31: qty 1

## 2019-05-31 MED ORDER — CIPROFLOXACIN HCL 500 MG PO TABS
500.0000 mg | ORAL_TABLET | Freq: Two times a day (BID) | ORAL | Status: DC
  Administered 2019-06-01: 500 mg via ORAL
  Filled 2019-05-31: qty 1

## 2019-05-31 NOTE — Progress Notes (Signed)
Family Medicine Teaching Service Daily Progress Note Intern Pager: [phone]  Patient name: Theresa Kim Medical record number: [id number] Date of birth: 1935-10-20 Age: 83 y.o. Gender: female  Primary Care Provider: Clovia Cuff, MD Consultants: GI Code Status: Full  Pt Overview and Major Events to Date:  05/29/2019: Admitted, GI consulted 05/30/2019: ERCP  Assessment and Plan: Theresa Kim is a 83 y.o. female presenting with shortness of breath and RUQ abdominal pain. PMHx significant for RA, osteoporosis, HTN and GERD   Choledocholithiasis  Cholangitis:   Patient presented with RUQ abdominal pain and nausea.  Patient with history of cholecystectomy in 2018. Is day 1 s/p ERCP, was found to have 3 stones measuring 1/5cm - 2.5cm. CT ABD/Pelvis indicated hepatic biliary ductal dilation with severely dilated common bile duct. -GI following - will plan for stone removal next week outpatient -Vitals per routine -Tylenol scheduled  -Zofran PRN -PT/OT eval and treat -Continue Zosyn (11/20 - )  Shortness of breath, improved Diffuse rales improved, are no longer present in LUL. Patient w/ R 8th rib fracture, could be worsening dyspnea. Consider etiology of ILD or PNA.  -Continue Zosyn (11/20 - )  -Continuous pulse oximetry   Rheumatoid arthritis, chronic, stable Home medsinclude prednisone 5mg  daily, methotrexate and gabapentin.She has been on steroids for several months.   -Continue home medication  GERD, chronic, stable Patient has issues with reflux and takes PPI approximately 3 times weekly.She is supposed to take the medicine daily -Due to severe osteoporosis, stop PPI and start on Pepcid as needed.   Osteoporosis, chronic, stable T and L-spine imaging indicate age-indeterminate T5, T7, T11, and L3 compression fractures as well as osteoporosis.  Left forearm imaging indicated old healed fractures of the distal left radius and ulna. Patient's last DEXA scan in  October 2020 showed T-score of 5.1. The patient also has a history of fractures to her coccyx and left wrist. Patient started treatment for osteoporosis outpatient ~2 weeks prior to admission. -Schedule Tylenol 650 mg every 6 hours - Recommend PCP consider tapering prednisone - Pepcid versus PPI  FEN/GI: Regular diet, replete electrolytes as needed  PPx: SCDs  Disposition: Most likely to DC home once clinically   Subjective:  Seen sitting in recliner, says she is comfortable as long as she does not move. Her daughter is in the room, very pleasant patient.  Objective: Temp:  [97.3 F (36.3 C)-98.6 F (37 C)] 98.1 F (36.7 C) (11/22 0835) Pulse Rate:  [49-85] 49 (11/22 0835) Resp:  [14-24] 24 (11/22 0835) BP: (115-149)/(35-103) 144/51 (11/22 0835) SpO2:  [91 %-99 %] 99 % (11/22 0835) Physical Exam: General: nontoxic appearing, no apparent distress Cardiovascular: RRR, S1S2 present, no murmurs Respiratory: Bilateral crackles appreciated to lung bases, continues to have shallow breathing most likely due to rib fracture Abdomen: soft, tenderness to RUQ, normal bowel sounds Extremities: no edema or ecchymosis  Laboratory: Recent Labs  Lab 05/29/19 0947 05/30/19 0314 05/31/19 0338  WBC 15.6* 8.2 6.8  HGB 14.4 11.5* 11.5*  HCT 43.7 36.2 36.0  PLT 180 154 136*   Recent Labs  Lab 05/29/19 0947 05/30/19 0314 05/31/19 0338  NA 139 141 143  K 3.5 3.2* 4.7  CL 102 108 112*  CO2 21* 25 24  BUN 19 12 9   CREATININE 0.93 0.90 0.86  CALCIUM 8.9 7.9* 7.7*  PROT 6.7 5.2* 5.3*  BILITOT 4.2* 2.3* 0.9  ALKPHOS 284* 191* 153*  ALT 195* 115* 74*  AST 325* 105* 50*  GLUCOSE 143* 113* 148*  Imaging/Diagnostic Tests: Dg Ribs Unilateral Right Result Date: 05/30/2019 1. Acute, nondisplaced lateral right eighth rib fracture. 2. Remote posterior right rib fractures.   Dg Ercp Biliary & Pancreatic Ducts Result Date: 05/30/2019 1. Choledocholithiasis. 2. Sphincterotomy, balloon  sweeping of the common duct and placement of a plastic biliary stent. These images were submitted for radiologic interpretation only. Please see the procedural report for the amount of contrast and the fluoroscopy time utilized.    Dollene Cleveland, DO 05/31/2019, 9:19 AM PGY-2, [facility] FPTS Intern pager: [phone], text pages welcome

## 2019-05-31 NOTE — Discharge Summary (Addendum)
Family Medicine Teaching [facility] Discharge Summary  Patient name: Theresa Kim Medical record number: [id number] Date of birth: 11-07-1935 Age: 83 y.o. Gender: female Date of Admission: 05/29/2019  Date of Discharge: 06/01/19  Admitting Physician: Latrelle Dodrill, MD  Primary Care Provider: Annita Brod, MD Consultants: GI   Indication for Hospitalization: Abdominal pain related to choledocholithiasis  Discharge Diagnoses/Problem List:  Choledocholithiasis with cholangitis s/p ERCP with stenting Right eighth rib fracture T5, T7, T11 and L3 compression fractures Severe osteoporosis Rheumatoid arthritis Hypertension GERD Depression  Disposition: Home  Discharge Condition: Stable and Improving   Discharge Exam:   GEN: pleasant, elderly female, conversational in no discrete distress CV: regular rate and rhythm, distal pulses intact RESP: no increased work of breathing, bibasilar rales  ABD: Bowel sounds present. Soft, Nontender, Nondistended MSK: no lower extremity edema, or cyanosis noted SKIN: warm, dry, no left-sided rib ecchymoses or lesions NEURO: grossly normal at patient's baseline, moves all extremities appropriately PSYCH: Normal affect and thought content   Brief Hospital Course:   Choledochal lithiasis  Cholangitis  Theresa Kim is a 83 y.o. female presented for worsening RUQ abdominal pain and persistent nausea. Patient has cholecystectomy in 2018. CT ABD/Pelvis indicated hepatic biliary ductal dilation with severely dilated common bile duct. GI performed ERCP on 05/30/19 and placed stent in bile duct. Leukocytosis and transaminases down-trended and were nearly normalized prior to discharge (see labs below). Patient was treated with IV Zosyn and transitioned to oral therapy.  Patient to finish 7-day course of Cipro outpatient (06/05/19).  Per GI, patient to have outpatient ERCP for stone extraction with advanced biliary endoscopist in 6-8  weeks and follow up LFTs in a week. GI office is to call patient to schedule this appointment.    Rib Fracture  Compression fractures  Osteoporosis  Patient reported bending down and hearing a pop resulting in right flank pain two-three weeks prior to arrival . Patient with 8th right rib fx seen on right rib series. Encouraged incentive spirometry throughout admission.  T and L-spine imaging indicate age-indeterminate T5, T7, T11, and L3 compression fractures. Patient's last DEXA scan in October 2020 showed T-score of 5.1. Due to her severe osteoporoses, we discontinued her PPI and switched her to famotidine.    Issues for Follow Up:    GI recommends repeat LFTs on 06/08/19 week and follow-up outpatient ERCP for biliary duct gallstone extraction.  Patient to complete a total of 7-day course of Ciprofloxacin to end on 11/27.  Patient to resume taking methotrexate after completion of antibiotic course.   Patient with rales on exam. Consider CT Chest for ILD or methotrexate related pulmonary fibrosis if this does not resolve at follow up visit.   Left ovarian cyst (2.4 cm) found on CT ABD/Pelvis. Radiology recommends 1 year follow up ultrasound to ensure stability.    Significant Procedures: ERCP   Significant Labs and Imaging:  Recent Labs  Lab 05/30/19 0314 05/31/19 0338 06/01/19 0539  WBC 8.2 6.8 9.2  HGB 11.5* 11.5* 11.2*  HCT 36.2 36.0 35.3*  PLT 154 136* 167   Recent Labs  Lab 05/29/19 0947 05/30/19 0314 05/31/19 0338 06/01/19 0539  NA 139 141 143 143  K 3.5 3.2* 4.7 4.0  CL 102 108 112* 112*  CO2 21* 25 24 25   GLUCOSE 143* 113* 148* 124*  BUN 19 12 9 13   CREATININE 0.93 0.90 0.86 0.97  CALCIUM 8.9 7.9* 7.7* 7.6*  ALKPHOS 284* 191* 153* 124  AST 325* 105* 50* 20  ALT 195* 115* 74* 50*  ALBUMIN 3.5 2.7* 2.4* 2.4*    Dg Chest 2 View  Result Date: 05/29/2019 CLINICAL DATA:  Shortness of breath. EXAM: CHEST - 2 VIEW COMPARISON:  No pertinent prior studies  available for comparison. FINDINGS: Evaluation of heart size is limited due to shallow inspiration. Aortic atherosclerosis. Shallow inspiration radiograph with crowding of the central bronchovascular markings. Bibasilar opacities (greater on the left) have an appearance most suggestive of incomplete atelectasis. No evidence of pneumothorax or sizable pleural effusion. Multiple age-indeterminate compression deformities within the mid to lower thoracic and upper lumbar spine. Surgical clips project over the right upper quadrant of the abdomen. IMPRESSION: Shallow inspiration radiograph. Probable mild pulmonary vascular congestion. Ill-defined bibasilar opacities have an appearance most suggestive of incomplete atelectasis. Multiple age-indeterminate compression deformities within the mid to lower thoracic and upper lumbar spine. Electronically Signed   By: Jackey LogeKyle  Golden DO   On: 05/29/2019 10:11   Dg Ribs Unilateral Right  Result Date: 05/30/2019 CLINICAL DATA:  Shortness of breath. Fall a few weeks ago with right-sided pain. EXAM: RIGHT RIBS - 2 VIEW COMPARISON:  Chest x-ray from yesterday FINDINGS: Acute fracture through the lateral right eighth rib which is nondisplaced. There are remote posterior right rib fractures with healed appearance on today study and comparison. Lower thoracic vertebral body wedging as described on prior, less wall cover today. No right hemothorax or pneumothorax. Osteopenia IMPRESSION: 1. Acute, nondisplaced lateral right eighth rib fracture. 2. Remote posterior right rib fractures. Electronically Signed   By: Marnee SpringJonathon  Watts M.D.   On: 05/30/2019 10:15   Dg Thoracic Spine 2 View  Result Date: 05/29/2019 CLINICAL DATA:  Compression fracture of vertebra. EXAM: THORACIC SPINE 2 VIEWS COMPARISON:  Reformats from abdominal CT earlier this day. Chest radiograph earlier this day. No prior imaging available. FINDINGS: Mild to moderate compression fracture of T11 vertebra, numbering based  on prior abdominopelvic CT. Probable mild T5 and possibly T7 compression deformity. Bones are diffusely under mineralized limiting assessment. No obvious paravertebral soft tissue abnormality. IMPRESSION: 1. Mild to moderate T11 compression fracture, age indeterminate. 2. Probable mild compression fractures of T5 and T7. Please note evaluation is limited due to underlying osteopenia/osteoporosis. Electronically Signed   By: Narda RutherfordMelanie  Sanford M.D.   On: 05/29/2019 23:35   Dg Lumbar Spine 2-3 Views  Result Date: 05/29/2019 CLINICAL DATA:  Compression fracture vertebra. EXAM: LUMBAR SPINE - 2-3 VIEW COMPARISON:  Reformats from abdominopelvic CT earlier this day. FINDINGS: Mild L3 superior endplate compression fracture with Schmorl's node. Mild to moderate T11 compression fracture. Remaining lumbar vertebral body heights are preserved. Underlying osteopenia/osteoporosis limits radiographic evaluation. Mild scoliotic curvature and multilevel degenerative change. Excreted IV contrast within the urinary bladder from prior CT. IMPRESSION: Mild L3 superior endplate compression fracture with Schmorl's node. Mild to moderate T11 compression fracture. Evaluation is limited due to underlying osteopenia/osteoporosis. Electronically Signed   By: Narda RutherfordMelanie  Sanford M.D.   On: 05/29/2019 23:36   Dg Forearm Left  Result Date: 05/29/2019 CLINICAL DATA:  Dental bone fixation hardware in place. EXAM: LEFT FOREARM - 2 VIEW COMPARISON:  None. FINDINGS: There is a plate and screws along the volar aspect of the distal left radius. The fracture of the distal radius has completely healed. There is an old healed fracture of the distal ulna with secondary deformity. There is diffuse osteopenia at the wrist. No acute abnormality. IMPRESSION: No acute abnormality. Old healed fractures of the distal left radius and ulna. Metal plate and screws along  the volar aspect of the distal left radius. Electronically Signed   By: Lorriane Shire M.D.    On: 05/29/2019 17:10   Ct Abdomen Pelvis W Contrast  Result Date: 05/29/2019 CLINICAL DATA:  Acute generalized abdominal pain. EXAM: CT ABDOMEN AND PELVIS WITH CONTRAST TECHNIQUE: Multidetector CT imaging of the abdomen and pelvis was performed using the standard protocol following bolus administration of intravenous contrast. CONTRAST:  80 mL OMNIPAQUE IOHEXOL 300 MG/ML  SOLN COMPARISON:  None. FINDINGS: Lower chest: No acute abnormality. Hepatobiliary: Status post cholecystectomy. Mild intrahepatic dilatation is noted with severely dilated common bile duct measuring 16 mm which may be due to post cholecystectomy status, but there does appear to be faint densities within the bile duct. No focal hepatic abnormality is noted. Pancreas: Unremarkable. No pancreatic ductal dilatation or surrounding inflammatory changes. Spleen: Normal in size without focal abnormality. Adrenals/Urinary Tract: Adrenal glands are unremarkable. Kidneys are normal, without renal calculi, focal lesion, or hydronephrosis. Bladder is unremarkable. Stomach/Bowel: Stomach is within normal limits. Appendix appears normal. No evidence of bowel wall thickening, distention, or inflammatory changes. Diverticulosis of descending colon is noted without inflammation. Vascular/Lymphatic: Aortic atherosclerosis. No enlarged abdominal or pelvic lymph nodes. Reproductive: Uterus is unremarkable. No right adnexal abnormality is noted. 2.4 cm left ovarian cyst is noted. Other: No abdominal wall hernia or abnormality. No abdominopelvic ascites. Musculoskeletal: No acute or significant osseous findings. IMPRESSION: Mild intrahepatic biliary ductal dilatation is noted, with severely dilated common bile duct. This may simply be due to post cholecystectomy status, but there does appear to be faint densities within the dilated common bile duct which may represent stones. Further evaluation with MRCP is recommended. 2.4 cm left ovarian cyst is noted. This most  likely is benign, but follow-up ultrasound in 1 year is recommended to ensure stability. Diverticulosis of descending colon without inflammation. Aortic Atherosclerosis (ICD10-I70.0). Electronically Signed   By: Marijo Conception M.D.   On: 05/29/2019 13:26   Dg Ercp Biliary & Pancreatic Ducts  Result Date: 05/30/2019 CLINICAL DATA:  83 year old female undergoing ERCP for choledocholithiasis EXAM: ERCP TECHNIQUE: Multiple spot images obtained with the fluoroscopic device and submitted for interpretation post-procedure. FLUOROSCOPY TIME:  Fluoroscopy Time:  2 minutes 20 seconds COMPARISON:  CT abdomen/pelvis 05/29/2019 FINDINGS: A total of 6 intraoperative saved images are obtained during ERCP. The images demonstrate a flexible endoscope in the descending duodenum with wire cannulation of the common bile duct followed by sphincterotomy and cholangiogram. Several faceted filling defects are present within the common hepatic duct. There is marked dilation of the common hepatic and common bile ducts. Subsequent images demonstrate balloon sweeping of the common duct. On the final image, a plastic biliary stent has been placed. IMPRESSION: 1. Choledocholithiasis. 2. Sphincterotomy, balloon sweeping of the common duct and placement of a plastic biliary stent. These images were submitted for radiologic interpretation only. Please see the procedural report for the amount of contrast and the fluoroscopy time utilized. Electronically Signed   By: Jacqulynn Cadet M.D.   On: 05/30/2019 13:01     Results/Tests Pending at Time of Discharge: None   Discharge Medications:  Allergies as of 06/01/2019   No Known Allergies     Medication List    STOP taking these medications   OMEPRAZOLE PO     TAKE these medications   ciprofloxacin 500 MG tablet Commonly known as: CIPRO Take 1 tablet (500 mg total) by mouth 2 (two) times daily for 5 days.   famotidine 20 MG tablet Commonly  known as: PEPCID Take 1 tablet  (20 mg total) by mouth daily. Start taking on: June 02, 2019   folic acid 1 MG tablet Commonly known as: FOLVITE Take 1 mg by mouth daily.   gabapentin 300 MG capsule Commonly known as: NEURONTIN Take 300 mg by mouth 3 (three) times daily.   methotrexate 2.5 MG tablet Commonly known as: RHEUMATREX Take 6 tablets (15 mg total) by mouth once a week. Wednesdays Start taking on: June 06, 2019   predniSONE 5 MG tablet Commonly known as: DELTASONE Take 5 mg by mouth daily.       Discharge Instructions: Please refer to Patient Instructions section of EMR for full details.  Patient was counseled important signs and symptoms that should prompt return to medical care, changes in medications, dietary instructions, activity restrictions, and follow up appointments.   Follow-Up Appointments: Follow-up Information    Annita Brod, MD Follow up.   Specialty: Internal Medicine Why: Make an appointment for lab work on 06/08/19 unless you have lab work at [facility] information: [address] [place] [address] [phone]        [facility] Follow up.   Why: You will get a call to schedule follow up ERCP procedure        Care, [facility] Follow up.   Specialty: Home Health Services Contact information: [address] [place] [address] [phone]           Katha Cabal, MD 06/01/2019, 3:09 PM PGY-1, [facility]  FPTS Upper-Level Resident Addendum I have independently interviewed and examined the patient. I have discussed the above with the original author and agree with their documentation. My edits for correction/addition/clarification are in blue. Please see also any attending notes.    Peggyann Shoals, DO PGY-2, [facility] 06/02/2019 9:07 AM  FPTS Service pager: [phone] (text pages welcome through [facility])

## 2019-05-31 NOTE — Plan of Care (Signed)
  Problem: Clinical Measurements: Goal: Ability to maintain clinical measurements within normal limits will improve Outcome: Progressing Goal: Will remain free from infection Outcome: Progressing Goal: Diagnostic test results will improve Outcome: Progressing   Problem: Coping: Goal: Level of anxiety will decrease Outcome: Progressing   

## 2019-05-31 NOTE — Progress Notes (Signed)
[facility] Progress Note  Chief Complaint: choledocholithiasis  History:  She denies abdominal pain.  Appetite remains fair.  She was hoping she could have her repeat ERCP while in the hospital  ROS: Cardiovascular: No chest pain Respiratory: No dyspnea Urinary: No dysuria  Objective:   Current Facility-Administered Medications:  .  acetaminophen (TYLENOL) tablet 650 mg, 650 mg, Oral, Q6H PRN **OR** acetaminophen (TYLENOL) suppository 650 mg, 650 mg, Rectal, Q6H PRN, Kerin Salen, MD .  folic acid (FOLVITE) tablet 1 mg, 1 mg, Oral, Daily, Kerin Salen, MD, 1 mg at 05/31/19 0904 .  gabapentin (NEURONTIN) capsule 300 mg, 300 mg, Oral, TID, Shirlean Mylar, MD, 300 mg at 05/31/19 1502 .  lidocaine (LIDODERM) 5 % 1 patch, 1 patch, Transdermal, QHS, Kerin Salen, MD, 1 patch at 05/30/19 2207 .  [START ON 06/03/2019] methotrexate (RHEUMATREX) tablet 15 mg, 15 mg, Oral, Weekly, Kerin Salen, MD .  pantoprazole (PROTONIX) EC tablet 40 mg, 40 mg, Oral, Daily, Kerin Salen, MD, 40 mg at 05/31/19 0904 .  piperacillin-tazobactam (ZOSYN) IVPB 3.375 g, 3.375 g, Intravenous, Q8H, Kerin Salen, MD, Last Rate: 12.5 mL/hr at 05/31/19 1501, 3.375 g at 05/31/19 1501 .  predniSONE (DELTASONE) tablet 5 mg, 5 mg, Oral, Daily, Kerin Salen, MD, 5 mg at 05/31/19 0903  . piperacillin-tazobactam (ZOSYN)  IV 3.375 g (05/31/19 1501)     Vital signs in last 24 hrs: Vitals:   05/31/19 0835 05/31/19 1444  BP: (!) 144/51 (!) 124/54  Pulse: (!) 49 (!) 51  Resp: (!) 24 (!) 22  Temp: 98.1 F (36.7 C) 98.2 F (36.8 C)  SpO2: 99% 99%    Intake/Output Summary (Last 24 hours) at 05/31/2019 1754 Last data filed at 05/31/2019 1556 Gross per 24 hour  Intake 693.77 ml  Output -  Net 693.77 ml     Physical Exam   HEENT: sclera anicteric, oral mucosa without lesions  Neck: supple, no thyromegaly, JVD or lymphadenopathy  Cardiac: RRR without murmurs, S1S2 heard, no peripheral edema  Pulm: Inspiratory crackles  as noted before.  Bilaterally, normal RR and effort noted  Abdomen: soft, no tenderness, with active bowel sounds. No guarding or palpable hepatosplenomegaly  Skin; warm and dry, no jaundice  Recent Labs:  CBC Latest Ref Rng & Units 05/31/2019 05/30/2019 05/29/2019  WBC 4.0 - 10.5 K/uL 6.8 8.2 15.6(H)  Hemoglobin 12.0 - 15.0 g/dL 11.5(L) 11.5(L) 14.4  Hematocrit 36.0 - 46.0 % 36.0 36.2 43.7  Platelets 150 - 400 K/uL 136(L) 154 180    Recent Labs  Lab 05/30/19 0314  INR 1.2   CMP Latest Ref Rng & Units 05/31/2019 05/30/2019 05/29/2019  Glucose 70 - 99 mg/dL 287(G) 811(X) 726(O)  BUN 8 - 23 mg/dL 9 12 19   Creatinine 0.44 - 1.00 mg/dL 0.35 5.97  Sodium 135 - 145 mmol/L 143 141 139  Potassium 3.5 - 5.1 mmol/L 4.7 3.2(L) 3.5  Chloride 98 - 111 mmol/L 112(H) 108 102  CO2 22 - 32 mmol/L 24 25 21(L)  Calcium 8.9 - 10.3 mg/dL 7.7(L) 7.9(L) 8.9  Total Protein 6.5 - 8.1 g/dL 5.3(L) 5.2(L) 6.7  Total Bilirubin 0.3 - 1.2 mg/dL 0.9 4.16) 4.2(H)  Alkaline Phos 38 - 126 U/L 153(H) 191(H) 284(H)  AST 15 - 41 U/L 50(H) 105(H) 325(H)  ALT 0 - 44 U/L 74(H) 115(H) 195(H)   Bcx negative   @ASSESSMENTPLANBEGIN @ Assessment: Choledocholithiasis Probable cholangitis at the time of admission with elevated WBC.  Blood cultures negative, but she should have a  total 7-day course of antibiotics.  I discontinued Zosyn and started oral ciprofloxacin  This patient will not have another ERCP as inpatient.  She has a stent placed in the bile duct is draining well based on her decreasing LFTs.  She will have another ERCP as an outpatient several weeks down the road with our advanced biliary endoscopist because that is a soon as he will be available to attend to this.  The biliary stent will keep her from developing obstruction in the meantime.  I explained this to her, and also had a conversation by phone with her daughter Verdis Frederickson.  Plan: Our service will check on her tomorrow or discharge.  We will  also arrange repeat LFTs about a week later and then we will contact them with plans for outpatient ERCP for stone extraction.  Total time 30 minutes, over half spent in conversations counseling and coordinating care (I also suggested to her daughter that they have her evaluated by her primary care doctor and/or rheumatologist and consider outpatient CT scan of the chest to see if she may have rheumatoid lung disease or pulmonary fibrosis from methotrexate, given her physical exam findings)  Nelida Meuse III Office: [phone]

## 2019-06-01 ENCOUNTER — Telehealth: Payer: Self-pay | Admitting: *Deleted

## 2019-06-01 ENCOUNTER — Other Ambulatory Visit: Payer: Self-pay | Admitting: *Deleted

## 2019-06-01 ENCOUNTER — Telehealth: Payer: Self-pay

## 2019-06-01 DIAGNOSIS — K8050 Calculus of bile duct without cholangitis or cholecystitis without obstruction: Secondary | ICD-10-CM

## 2019-06-01 DIAGNOSIS — D509 Iron deficiency anemia, unspecified: Secondary | ICD-10-CM

## 2019-06-01 LAB — COMPREHENSIVE METABOLIC PANEL
ALT: 50 U/L — ABNORMAL HIGH (ref 0–44)
AST: 20 U/L (ref 15–41)
Albumin: 2.4 g/dL — ABNORMAL LOW (ref 3.5–5.0)
Alkaline Phosphatase: 124 U/L (ref 38–126)
Anion gap: 6 (ref 5–15)
BUN: 13 mg/dL (ref 8–23)
CO2: 25 mmol/L (ref 22–32)
Calcium: 7.6 mg/dL — ABNORMAL LOW (ref 8.9–10.3)
Chloride: 112 mmol/L — ABNORMAL HIGH (ref 98–111)
Creatinine, Ser: 0.97 mg/dL (ref 0.44–1.00)
GFR calc Af Amer: >60 mL/min (ref >60)
GFR calc non Af Amer: 55 mL/min — ABNORMAL LOW (ref >60)
Glucose, Bld: 124 mg/dL — ABNORMAL HIGH (ref 70–99)
Potassium: 4 mmol/L (ref 3.5–5.1)
Sodium: 143 mmol/L (ref 135–145)
Total Bilirubin: 1 mg/dL (ref 0.3–1.2)
Total Protein: 5.1 g/dL — ABNORMAL LOW (ref 6.5–8.1)

## 2019-06-01 LAB — CBC
HCT: 35.3 % — ABNORMAL LOW (ref 36.0–46.0)
Hemoglobin: 11.2 g/dL — ABNORMAL LOW (ref 12.0–15.0)
MCH: 32.2 pg (ref 26.0–34.0)
MCHC: 31.7 g/dL (ref 30.0–36.0)
MCV: 101.4 fL — ABNORMAL HIGH (ref 80.0–100.0)
Platelets: 167 10*3/uL (ref 150–400)
RBC: 3.48 MIL/uL — ABNORMAL LOW (ref 3.87–5.11)
RDW: 15.1 % (ref 11.5–15.5)
WBC: 9.2 10*3/uL (ref 4.0–10.5)
nRBC: 0.2 % (ref 0.0–0.2)

## 2019-06-01 MED ORDER — CIPROFLOXACIN HCL 500 MG PO TABS: 500.0000 mg | ORAL_TABLET | Freq: Two times a day (BID) | ORAL | 0 refills | Status: AC

## 2019-06-01 MED ORDER — FAMOTIDINE 20 MG PO TABS: 20.0000 mg | ORAL_TABLET | Freq: Every day | ORAL | 0 refills | Status: AC

## 2019-06-01 MED ORDER — FAMOTIDINE 20 MG PO TABS
20.0000 mg | ORAL_TABLET | Freq: Every day | ORAL | Status: DC
  Administered 2019-06-01: 20 mg via ORAL
  Filled 2019-06-01: qty 1

## 2019-06-01 MED ORDER — METHOTREXATE 2.5 MG PO TABS: 15.0000 mg | ORAL_TABLET | ORAL | 0 refills | Status: AC

## 2019-06-01 NOTE — Telephone Encounter (Signed)
The pt will be contacted in 6-8 weeks to setup ERCP. The schedule is not out today.

## 2019-06-01 NOTE — Progress Notes (Signed)
          Daily Rounding Note  06/01/2019, 12:21 PM  LOS: 3 days   SUBJECTIVE:   Chief complaint: Choledocholithiasis.    Still having pain on the right flank/ribs.  No significant visceral pain.  Appetite marginal, does not really like the food but tolerating current diet. Soft to loose bowel movement yesterday and again today.  No vomiting or nausea Some shortness of breath with exertion.  OBJECTIVE:         Vital signs in last 24 hours:    Temp:  [97.9 F (36.6 C)-98.2 F (36.8 C)] 98.1 F (36.7 C) (11/23 0353) Pulse Rate:  [51-60] 53 (11/23 0353) Resp:  [18-22] 18 (11/22 2225) BP: (115-159)/(54-57) 159/56 (11/23 0353) SpO2:  [97 %-99 %] 97 % (11/23 0353) Last BM Date: 05/30/19 Filed Weights   05/29/19 2009  Weight: 50.2 kg   General: Frail, chronically ill looking but comfortable. Heart: RRR Chest: Fine rales in the bases.  No labored breathing.  No cough. Abdomen: Soft.  Tenderness wo guarding or rebound in the lateral right abdomen and flank. Extremities: No CCE. Neuro/Psych: Alert.  Fully oriented x3.  Moves all 4 limbs.  Intake/Output from previous day: 11/22 0701 - 11/23 0700 In: 61.3 [IV Piggyback:61.3] Out: -   Intake/Output this shift: Total I/O In: 200 [P.O.:200] Out: -   Lab Results: Recent Labs    05/30/19 0314 05/31/19 0338 06/01/19 0539  WBC 8.2 6.8 9.2  HGB 11.5* 11.5* 11.2*  HCT 36.2 36.0 35.3*  PLT 154 136* 167   BMET Recent Labs    05/30/19 0314 05/31/19 0338 06/01/19 0539  NA 141 143 143  K 3.2* 4.7 4.0  CL 108 112* 112*  CO2 25 24 25   GLUCOSE 113* 148* 124*  BUN 12 9 13   CREATININE 0.90 0.86 0.97  CALCIUM 7.9* 7.7* 7.6*   LFT Recent Labs    05/30/19 0314 05/31/19 0338 06/01/19 0539  PROT 5.2* 5.3* 5.1*  ALBUMIN 2.7* 2.4* 2.4*  AST 105* 50* 20  ALT 115* 74* 50*  ALKPHOS 191* 153* 124  BILITOT 2.3* 0.9 1.0   PT/INR Recent Labs    05/30/19 0314  LABPROT 14.6   INR 1.2   Hepatitis Panel No results for input(s): HEPBSAG, HCVAB, HEPAIGM, HEPBIGM in the last 72 hours.  Studies/Results: No results found.  ASSESMENT:   *    Choledocholithiasis. 11/21 ERCP with stent placement.  Dilated CBD, at least 3 large stones up to 2.5 cm in size.  Sphincterotomy, balloon sweep performed.  7 [place], 7 cm plastic stent placed to CBD. LFTs improved. Needs repeat ERCP with spyglass and lithotripsy of the stones.  This will be arranged for outpatient setting.  *   Right rib pain.  X-rays show old right eighth rib fracture but nothing new along with multiple age-indeterminate compression deformities.   PLAN   *   GI, Dr. Loletha Carrow and Mansouraty will reach out to the patient in the next several days regarding timing, date for repeat ERCP.   Obtain outpt  LFTs ~ 11/30.    *   GI signing off.    Azucena Freed  06/01/2019, 12:21 PM Phone [phone]

## 2019-06-01 NOTE — Progress Notes (Addendum)
Family Medicine Teaching Service Daily Progress Note Intern Pager: [phone]  Patient name: Theresa Kim Medical record number: [id number] Date of birth: [birth year] Age: 83 y.o. Gender: female  Primary Care Provider: Clovia Cuff, MD Consultants: GI Code Status: Full  Pt Overview and Major Events to Date:  05/29/2019: Admitted, GI consulted 05/30/2019: ERCP  Assessment and Plan: Theresa Kim is a 83 y.o. female presenting with shortness of breath and RUQ abdominal pain. PMHx significant for RA, osteoporosis, HTN and GERD   Choledocholithiasis  Cholangitis Patient reported abdominal pain yesterday denies pain this morning.  There has been no nausea and vomiting. Patient s/p stent placement in bile duct during ERCP on 05/30/19.  LFTs are down-trending. Per GI, no inpatient repeat ERCP required.  Leukocytosis has resolved and patient to continue with 7-day course of oral antibiotic therapy.  Patient to have another ERCP outpatient in several weeks.  GI to arrange repeat LFTs in about a week and then schedule outpatient ERCP. if GI agrees patient to be discharged today. -GI following - to see today  -Vitals per routine -Tylenol scheduled  -Zofran PRN -PT/OT eval and treat -Discontinued Zosyn (11/20 - 11/23) -Cipro (11/23 - ) Day 2 / 7   Shortness of breath  Rib fx Denies shortness of breath.  Has new left-sided rib pain today. Patient with 8th right rib fx potentially limiting patients inspiration which could predispose patient to developing pneumonia. Consider Chest CT as patient has RA for concerns for pulmonary fibrosis or ILD.  - Cipro Day 2/7 -Continuous pulse oximetry   Rheumatoid arthritis, chronic, stable Home medsinclude prednisone 5mg  daily, methotrexate and gabapentin.She has been on steroids for several months.   -Continue home medication  GERD, chronic, stable Patient has issues with reflux and takes PPI approximately 3 times weekly.She is supposed to  take the medicine daily -Due to severe osteoporosis, stop PPI and start on Pepcid as needed.   Osteoporosis, chronic, stable T and L-spine imaging indicate age-indeterminate T5, T7, T11, and L3 compression fractures as well as osteoporosis.  Left forearm imaging indicated old healed fractures of the distal left radius and ulna. Patient's last DEXA scan in October 2020 showed T-score of 5.1. The patient also has a history of fractures to her coccyx and left wrist. Patient started treatment for osteoporosis outpatient ~2 weeks prior to admission. -Schedule Tylenol 650 mg every 6 hours - Recommend PCP consider tapering prednisone - Pepcid versus PPI  FEN/GI: Regular diet, replete electrolytes as needed  PPx: SCDs  Disposition: Most likely to DC home once clinically   Subjective:  Theresa Kim had no significant overnight events.  Patient complains of new left-sided rib pain today.   Objective: Temp:  [97.9 F (36.6 C)-98.2 F (36.8 C)] 98.1 F (36.7 C) (11/23 0353) Pulse Rate:  [49-60] 53 (11/23 0353) Resp:  [18-24] 18 (11/22 2225) BP: (115-159)/(51-57) 159/56 (11/23 0353) SpO2:  [97 %-99 %] 97 % (11/23 0353) Physical Exam:  GEN: pleasant, elderly female, conversational in no discrete distress CV: regular rate and rhythm, distal pulses intact RESP: no increased work of breathing, bibasilar rales  ABD: Bowel sounds present. Soft, Nontender, Nondistended MSK: no lower extremity edema, or cyanosis noted SKIN: warm, dry, no left-sided rib ecchymoses or lesions     Laboratory: Recent Labs  Lab 05/30/19 0314 05/31/19 0338 06/01/19 0539  WBC 8.2 6.8 9.2  HGB 11.5* 11.5* 11.2*  HCT 36.2 36.0 35.3*  PLT 154 136* 167   Recent Labs  Lab 05/30/19 0314 05/31/19 5176  06/01/19 0539  NA 141 143 143  K 3.2* 4.7 4.0  CL 108 112* 112*  CO2 25 24 25   BUN 12 9 13   CREATININE 0.90 0.86 0.97  CALCIUM 7.9* 7.7* 7.6*  PROT 5.2* 5.3* 5.1*  BILITOT 2.3* 0.9 1.0  ALKPHOS 191*  153* 124  ALT 115* 74* 50*  AST 105* 50* 20  GLUCOSE 113* 148* 124*    Imaging/Diagnostic Tests: Dg Ribs Unilateral Right Result Date: 05/30/2019 1. Acute, nondisplaced lateral right eighth rib fracture. 2. Remote posterior right rib fractures.   Dg Ercp Biliary & Pancreatic Ducts Result Date: 05/30/2019 1. Choledocholithiasis. 2. Sphincterotomy, balloon sweeping of the common duct and placement of a plastic biliary stent. These images were submitted for radiologic interpretation only. Please see the procedural report for the amount of contrast and the fluoroscopy time utilized.    06/01/2019, DO PGY-1, [facility] 06/01/2019 9:37 AM     FPTS Intern pager: [phone], text pages welcome

## 2019-06-01 NOTE — Telephone Encounter (Signed)
-----   Message from Irving Copas., MD sent at 06/01/2019  7:35 AM EST ----- HD, No worries it is reasonable to be ready for EHL if we cannot make a large enough sphincterotomy/sphincteroplasty. HD, will you see her in follow up? I am OK to just go direct to procedure in 6-8 weeks, that would be OK if you see her and she is OK with that.  Otherwise happy to talk with her in clinic before. Krystina Strieter, proceed with getting her on the schedule with me for an ERCP with EHL 2 hour case. Let us know when the date will be. Thanks. GM ----- Message ----- From: Doran Stabler, MD Sent: 05/31/2019   6:09 PM EST To: Dalene Seltzer, RN, #  Gabe,    Will talk to you after holiday, but this patient will need ERCP in several weeks for help with multiple large biliary stones found by [name] (covering me this weekend for ERCP).     Brittani,    Needs LFTs in 7-10 days after discharge, either with [place] or primary care if more convenient for them depending on where they live.  Best contact is her daughter Verdis Frederickson (number in chart).  - HD

## 2019-06-01 NOTE — Telephone Encounter (Signed)
-----   Message from Nelida Meuse III, MD sent at 05/31/2019  6:09 PM EST ----- Theresa Kim,    Will talk to you after holiday, but this patient will need ERCP in several weeks for help with multiple large biliary stones found by [name] (covering me this weekend for ERCP).     Wadie Liew,    Needs LFTs in 7-10 days after discharge, either with [place] or primary care if more convenient for them depending on where they live.  Best contact is her daughter Verdis Frederickson (number in chart).  - HD

## 2019-06-01 NOTE — Progress Notes (Signed)
Norman Clay to be D/C'd  per MD order. Discussed with the patient and all questions fully answered.  VSS, Skin clean, dry and intact without evidence of skin break down, no evidence of skin tears noted.  IV catheter discontinued intact. Site without signs and symptoms of complications. Dressing and pressure applied.  An After Visit Summary was printed and given to the patient. Patient received prescription.  D/c education completed with patient/family including follow up instructions, medication list, d/c activities limitations if indicated, with other d/c instructions as indicated by MD - patient able to verbalize understanding, all questions fully answered.   Patient instructed to return to ED, call 911, or call MD for any changes in condition.   Patient to be escorted via [place], and D/C home via private auto.

## 2019-06-01 NOTE — TOC Initial Note (Signed)
Transition of Care [facility]) - Initial/Assessment Note    Patient Details  Name: Theresa Kim MRN: [id number] Date of Birth: 08-08-1935  Transition of Care [facility]) CM/SW Contact:    Marilu Favre, RN Phone Number: 06/01/2019, 2:32 PM  Clinical Narrative:                 Spoke to patient and her daughter at bedside. Confirmed face sheet information provided choice. They want [facility], called Tommi Rumps with [place]. Patient has walker and wheel chair at home already.   Expected Discharge Plan: [place] Barriers to Discharge: No Barriers Identified   Patient Goals and CMS Choice Patient states their goals for this hospitalization and ongoing recovery are:: to go home CMS Medicare.gov Compare Post Acute Care list provided to:: Patient Choice offered to / list presented to : Patient, Adult Children(Maria daughter)  Expected Discharge Plan and Services Expected Discharge Plan: [place] Choice: [place] arrangements for the past 2 months: Single Family Home Expected Discharge Date: 06/01/19               DME Arranged: N/A         HH Arranged: PT HH Agency: [place] Date [place]: 06/01/19 Time HH Agency Contacted: [id number] Representative spoke with at [place]: Tommi Rumps  Prior Living Arrangements/Services Living arrangements for the past 2 months: [place] Lives with:: Adult Children Patient language and need for interpreter reviewed:: Yes Do you feel safe going back to the place where you live?: Yes      Need for Family Participation in Patient Care: Yes (Comment) Care giver support system in place?: Yes (comment) Current home services: DME Criminal Activity/Legal Involvement Pertinent to Current Situation/Hospitalization: No - Comment as needed  Activities of Daily Living      Permission Sought/Granted   Permission granted to share information with : Yes, Verbal Permission  Granted  Share Information with NAME: Verdis Frederickson  Permission granted to share info w AGENCY: [facility]  Permission granted to share info w Relationship: daughter     Emotional Assessment Appearance:: Appears stated age Attitude/Demeanor/Rapport: Engaged Affect (typically observed): Accepting Orientation: : Oriented to Self, Oriented to Place, Oriented to  Time, Oriented to Situation Alcohol / Substance Use: Not Applicable Psych Involvement: No (comment)  Admission diagnosis:  Acute cholangitis [K83.09] Metal bone fixation hardware in place [Z96.7] Patient Active Problem List   Diagnosis Date Noted  . Acute cholangitis   . Choledocholithiasis 05/29/2019  . Neuropathy 05/29/2019  . 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 Essential hypertension 04/10/2017  . Gastroesophageal reflux disease 04/10/2017  . Depressive disorder 04/10/2017  . Osteoporosis 04/10/2017  . Iron deficiency anemia 04/10/2017  . Muscle weakness 04/10/2017  . Rheumatoid arthritis ([place]) 04/10/2017   PCP:  Clovia Cuff, MD Pharmacy:   [id number] - [place], [place] - [address] [place] [address] Phone: [phone] Fax: [phone]     Social Determinants of Health (SDOH) Interventions    Readmission Risk Interventions No flowsheet data found.

## 2019-06-01 NOTE — Telephone Encounter (Signed)
FYI- Orders for LFT's placed in Epic, daughter Verdis Frederickson aware and reported to this RN that her and her husband will be going out of town and will be able to bring patient in for labs in 2 weeks around 12/7.

## 2019-06-01 NOTE — Discharge Instructions (Addendum)
You were hospitalized at [facility] due to right upper abdominal pain.  We expect this is from gallstone blocking your bile tube which improved after stenting the duct open during ERCP procedure.  We are so glad you are feeling better.  Be sure to follow-up with your regularly scheduled PCP and GI appointments.  Please also be sure to follow-up with your PCP at your earliest convenience.  Thank you for allowing [place] to take care of you.  - Please take you antibiotics (Ciprofloxacin) for the next 5 days  - Do not take methotrexate until you finish taking your antibiotics.  Resume methotrexate after your complete antibiotics.  - Make an appointment for lab work on 06/08/19 with your PCP unless you have lab work at [place]   Additionally, a left ovarian cyst (2.4 cm) was found on CT ABD/Pelvis. It is recommended you have a 1 year follow up ultrasound to reassess cyst.     Take care, [facility] family medicine team

## 2019-06-03 LAB — CULTURE, BLOOD (ROUTINE X 2)
Culture: NO GROWTH
Culture: NO GROWTH
Special Requests: ADEQUATE

## 2019-06-08 ENCOUNTER — Encounter (HOSPITAL_COMMUNITY): Payer: Self-pay | Admitting: Gastroenterology

## 2019-06-11 NOTE — Telephone Encounter (Signed)
Thanks Mallie Mussel. RN Gerarda Fraction is away the rest of this week but she'll take care of things early next week.  Lots of scheduling things were pending my January schedule finally becoming available. Patty, please let HD and I know when she elects to have procedure. Thanks as always. GM

## 2019-06-11 NOTE — Telephone Encounter (Signed)
Patty,    Please see the staff message from Dr. Rush Landmark on 06/01/19 trying to set up ERCP with him for EHL/stone extraction 4-6 weeks out from the 05/30/19 inpatient ERCP.  - HD

## 2019-06-15 ENCOUNTER — Other Ambulatory Visit (INDEPENDENT_AMBULATORY_CARE_PROVIDER_SITE_OTHER): Payer: Medicare PPO

## 2019-06-15 DIAGNOSIS — D509 Iron deficiency anemia, unspecified: Secondary | ICD-10-CM

## 2019-06-15 LAB — HEPATIC FUNCTION PANEL
ALT: 20 U/L (ref 0–35)
AST: 23 U/L (ref 0–37)
Albumin: 3.8 g/dL (ref 3.5–5.2)
Alkaline Phosphatase: 121 U/L — ABNORMAL HIGH (ref 39–117)
Bilirubin, Direct: 0.1 mg/dL (ref 0.0–0.3)
Total Bilirubin: 0.7 mg/dL (ref 0.2–1.2)
Total Protein: 6.7 g/dL (ref 6.0–8.3)

## 2019-06-16 ENCOUNTER — Other Ambulatory Visit: Payer: Self-pay

## 2019-06-16 DIAGNOSIS — K8050 Calculus of bile duct without cholangitis or cholecystitis without obstruction: Secondary | ICD-10-CM

## 2019-06-16 DIAGNOSIS — K8081 Other cholelithiasis with obstruction: Secondary | ICD-10-CM

## 2019-06-16 NOTE — Telephone Encounter (Signed)
Thank you for the update Patty °

## 2019-06-16 NOTE — Telephone Encounter (Signed)
The pt has been scheduled for 07/13/19 at 730 am at [facility] for ERCP with Dr Rush Landmark.  The pt has been instructed and information mailed to the pt home.  She was also advised about the COVID testing for 12/31 and quarantine information.  The pt has been advised of the information and verbalized understanding.

## 2019-06-23 ENCOUNTER — Other Ambulatory Visit: Payer: Self-pay | Admitting: Family Medicine

## 2019-07-08 ENCOUNTER — Other Ambulatory Visit: Payer: Self-pay

## 2019-07-08 ENCOUNTER — Encounter (HOSPITAL_COMMUNITY): Payer: Self-pay | Admitting: Gastroenterology

## 2019-07-08 NOTE — Progress Notes (Signed)
Spoke with Patient's Daughter Verdis Frederickson (Patient lives with daughter).  Verdis Frederickson states patient does not have any shortness of breath, fever, cough or chest pain.  PCP -  Dr Herminio Heads Cardiologist - denies Rheumatology = Dr Trudie Reed  Chest x-ray - 05/29/19 EKG - 06/01/19 Stress Test -denies ECHO - denies Cardiac Cath - denies  Anesthesia review: no  STOP now taking any Aspirin (unless otherwise instructed by your surgeon), Aleve, Naproxen, Ibuprofen, Motrin, Advil, Goody's, BC's, all herbal medications, fish oil, and all vitamins.   Coronavirus Screening Have you experienced the following symptoms:  Cough yes/no: No Fever (>100.81F)  yes/no: No Runny nose yes/no: No Sore throat yes/no: No Difficulty breathing/shortness of breath  yes/no: No  Have you traveled in the last 14 days and where? yes/no: No  Verdis Frederickson verbalized understanding of instructions that were given via phone.

## 2019-07-09 ENCOUNTER — Other Ambulatory Visit (HOSPITAL_COMMUNITY)
Admission: RE | Admit: 2019-07-09 | Discharge: 2019-07-09 | Disposition: A | Discharge status: Home / Self Care | Payer: Medicare PPO | Source: Ambulatory Visit | Attending: Gastroenterology | Admitting: Gastroenterology

## 2019-07-09 DIAGNOSIS — Z01812 Encounter for preprocedural laboratory examination: Secondary | ICD-10-CM | POA: Insufficient documentation

## 2019-07-09 DIAGNOSIS — Z20828 Contact with and (suspected) exposure to other viral communicable diseases: Secondary | ICD-10-CM | POA: Diagnosis not present

## 2019-07-10 LAB — NOVEL CORONAVIRUS, NAA (HOSP ORDER, SEND-OUT TO REF LAB; TAT 18-24 HRS): SARS-CoV-2, NAA: NOT DETECTED

## 2019-07-12 NOTE — Anesthesia Preprocedure Evaluation (Addendum)
Anesthesia Evaluation  Patient identified by MRN, date of birth, ID band Patient awake    Reviewed: Allergy & Precautions, NPO status , Patient's Chart, lab work & pertinent test results  Airway Mallampati: II  TM Distance: >3 FB Neck ROM: Full    Dental  (+) Dental Advisory Given, Chipped, Poor Dentition, Missing   Pulmonary neg pulmonary ROS,    Pulmonary exam normal breath sounds clear to auscultation       Cardiovascular Normal cardiovascular exam Rhythm:Regular Rate:Normal     Neuro/Psych PSYCHIATRIC DISORDERS Depression negative neurological ROS     GI/Hepatic Neg liver ROS, GERD  Medicated,Gall stones    Endo/Other  negative endocrine ROS  Renal/GU negative Renal ROS     Musculoskeletal  (+) Arthritis , Rheumatoid disorders,    Abdominal   Peds  Hematology negative hematology ROS (+)   Anesthesia Other Findings Day of surgery medications reviewed with the patient.  Reproductive/Obstetrics                            Anesthesia Physical Anesthesia Plan  ASA: III  Anesthesia Plan: General   Post-op Pain Management:    Induction: Intravenous  PONV Risk Score and Plan: 3 and Dexamethasone and Ondansetron  Airway Management Planned: Oral ETT  Additional Equipment:   Intra-op Plan:   Post-operative Plan: Extubation in OR  Informed Consent: I have reviewed the patients History and Physical, chart, labs and discussed the procedure including the risks, benefits and alternatives for the proposed anesthesia with the patient or authorized representative who has indicated his/her understanding and acceptance.     Dental advisory given  Plan Discussed with: CRNA  Anesthesia Plan Comments:        Anesthesia Quick Evaluation

## 2019-07-13 ENCOUNTER — Other Ambulatory Visit: Payer: Self-pay

## 2019-07-13 ENCOUNTER — Ambulatory Visit (HOSPITAL_COMMUNITY): Payer: Medicare PPO

## 2019-07-13 ENCOUNTER — Ambulatory Visit (HOSPITAL_COMMUNITY): Payer: Medicare PPO | Admitting: Anesthesiology

## 2019-07-13 ENCOUNTER — Encounter (HOSPITAL_COMMUNITY): Payer: Self-pay | Admitting: Gastroenterology

## 2019-07-13 ENCOUNTER — Encounter (HOSPITAL_COMMUNITY)
Admission: RE | Disposition: A | Discharge status: Home / Self Care | Payer: Self-pay | Source: Home / Self Care | Attending: Gastroenterology

## 2019-07-13 ENCOUNTER — Ambulatory Visit (HOSPITAL_COMMUNITY)
Admission: RE | Admit: 2019-07-13 | Discharge: 2019-07-13 | Disposition: A | Discharge status: Home / Self Care | Payer: Medicare PPO | Attending: Gastroenterology | Admitting: Gastroenterology

## 2019-07-13 ENCOUNTER — Telehealth: Payer: Self-pay

## 2019-07-13 DIAGNOSIS — K219 Gastro-esophageal reflux disease without esophagitis: Secondary | ICD-10-CM | POA: Diagnosis not present

## 2019-07-13 DIAGNOSIS — M069 Rheumatoid arthritis, unspecified: Secondary | ICD-10-CM | POA: Diagnosis not present

## 2019-07-13 DIAGNOSIS — Z4659 Encounter for fitting and adjustment of other gastrointestinal appliance and device: Secondary | ICD-10-CM | POA: Diagnosis not present

## 2019-07-13 DIAGNOSIS — Z9049 Acquired absence of other specified parts of digestive tract: Secondary | ICD-10-CM | POA: Insufficient documentation

## 2019-07-13 DIAGNOSIS — K8050 Calculus of bile duct without cholangitis or cholecystitis without obstruction: Secondary | ICD-10-CM | POA: Diagnosis present

## 2019-07-13 DIAGNOSIS — F329 Major depressive disorder, single episode, unspecified: Secondary | ICD-10-CM | POA: Insufficient documentation

## 2019-07-13 DIAGNOSIS — K838 Other specified diseases of biliary tract: Secondary | ICD-10-CM | POA: Diagnosis not present

## 2019-07-13 DIAGNOSIS — K8081 Other cholelithiasis with obstruction: Secondary | ICD-10-CM

## 2019-07-13 HISTORY — PX: ENDOSCOPIC RETROGRADE CHOLANGIOPANCREATOGRAPHY (ERCP) WITH PROPOFOL: SHX5810

## 2019-07-13 HISTORY — PX: BILIARY DILATION: SHX6850

## 2019-07-13 HISTORY — PX: SPHINCTEROTOMY: SHX5544

## 2019-07-13 HISTORY — PX: GASTROINTESTINAL STENT REMOVAL: SHX6384

## 2019-07-13 HISTORY — DX: Gastro-esophageal reflux disease without esophagitis: K21.9

## 2019-07-13 HISTORY — PX: REMOVAL OF STONES: SHX5545

## 2019-07-13 SURGERY — ENDOSCOPIC RETROGRADE CHOLANGIOPANCREATOGRAPHY (ERCP) WITH PROPOFOL
Anesthesia: General

## 2019-07-13 MED ORDER — EPHEDRINE SULFATE-NACL 50-0.9 MG/10ML-% IV SOSY
PREFILLED_SYRINGE | INTRAVENOUS | Status: DC | PRN
  Administered 2019-07-13: 10 mg via INTRAVENOUS

## 2019-07-13 MED ORDER — ROCURONIUM BROMIDE 10 MG/ML (PF) SYRINGE
PREFILLED_SYRINGE | INTRAVENOUS | Status: DC | PRN
  Administered 2019-07-13: 40 mg via INTRAVENOUS
  Administered 2019-07-13: 20 mg via INTRAVENOUS

## 2019-07-13 MED ORDER — PHENYLEPHRINE 40 MCG/ML (10ML) SYRINGE FOR IV PUSH (FOR BLOOD PRESSURE SUPPORT)
PREFILLED_SYRINGE | INTRAVENOUS | Status: DC | PRN
  Administered 2019-07-13: 120 ug via INTRAVENOUS
  Administered 2019-07-13 (×2): 80 ug via INTRAVENOUS

## 2019-07-13 MED ORDER — CIPROFLOXACIN IN D5W 400 MG/200ML IV SOLN
INTRAVENOUS | Status: AC
  Filled 2019-07-13: qty 200

## 2019-07-13 MED ORDER — INDOMETHACIN 50 MG RE SUPP
RECTAL | Status: DC | PRN
  Administered 2019-07-13: 100 mg via RECTAL

## 2019-07-13 MED ORDER — PROPOFOL 10 MG/ML IV BOLUS
INTRAVENOUS | Status: DC | PRN
  Administered 2019-07-13: 80 mg via INTRAVENOUS

## 2019-07-13 MED ORDER — GLUCAGON HCL RDNA (DIAGNOSTIC) 1 MG IJ SOLR
INTRAMUSCULAR | Status: DC | PRN
  Administered 2019-07-13 (×4): .25 mg via INTRAVENOUS

## 2019-07-13 MED ORDER — ONDANSETRON HCL 4 MG/2ML IJ SOLN: 4.0000 mg | Freq: Once | INTRAMUSCULAR | Status: DC | PRN

## 2019-07-13 MED ORDER — INDOMETHACIN 50 MG RE SUPP
RECTAL | Status: AC
  Filled 2019-07-13: qty 2

## 2019-07-13 MED ORDER — LIDOCAINE 2% (20 MG/ML) 5 ML SYRINGE
INTRAMUSCULAR | Status: DC | PRN
  Administered 2019-07-13: 60 mg via INTRAVENOUS

## 2019-07-13 MED ORDER — ESMOLOL HCL 100 MG/10ML IV SOLN
INTRAVENOUS | Status: DC | PRN
  Administered 2019-07-13: 20 ug via INTRAVENOUS

## 2019-07-13 MED ORDER — CIPROFLOXACIN IN D5W 400 MG/200ML IV SOLN
400.0000 mg | Freq: Once | INTRAVENOUS | Status: AC
  Administered 2019-07-13: 08:00:00 400 mg via INTRAVENOUS

## 2019-07-13 MED ORDER — SODIUM CHLORIDE 0.9 % IV SOLN: INTRAVENOUS | Status: DC

## 2019-07-13 MED ORDER — LACTATED RINGERS IV SOLN: INTRAVENOUS | Status: DC

## 2019-07-13 MED ORDER — FENTANYL CITRATE (PF) 100 MCG/2ML IJ SOLN: 25.0000 ug | INTRAMUSCULAR | Status: DC | PRN

## 2019-07-13 MED ORDER — SODIUM CHLORIDE 0.9 % IV SOLN
INTRAVENOUS | Status: DC | PRN
  Administered 2019-07-13: 10:00:00 110 mL

## 2019-07-13 MED ORDER — GLUCAGON HCL RDNA (DIAGNOSTIC) 1 MG IJ SOLR
INTRAMUSCULAR | Status: AC
  Filled 2019-07-13: qty 1

## 2019-07-13 MED ORDER — FENTANYL CITRATE (PF) 250 MCG/5ML IJ SOLN
INTRAMUSCULAR | Status: DC | PRN
  Administered 2019-07-13 (×2): 25 ug via INTRAVENOUS
  Administered 2019-07-13: 50 ug via INTRAVENOUS

## 2019-07-13 MED ORDER — SUGAMMADEX SODIUM 200 MG/2ML IV SOLN
INTRAVENOUS | Status: DC | PRN
  Administered 2019-07-13 (×2): 100 mg via INTRAVENOUS

## 2019-07-13 MED ORDER — CIPROFLOXACIN HCL 500 MG PO TABS: 500.0000 mg | ORAL_TABLET | Freq: Two times a day (BID) | ORAL | 0 refills | Status: AC

## 2019-07-13 MED ORDER — ONDANSETRON HCL 4 MG/2ML IJ SOLN
INTRAMUSCULAR | Status: DC | PRN
  Administered 2019-07-13: 4 mg via INTRAVENOUS

## 2019-07-13 NOTE — Discharge Instructions (Signed)
YOU HAD AN ENDOSCOPIC PROCEDURE TODAY: Refer to the procedure report and other information in the discharge instructions given to you for any specific questions about what was found during the examination. If this information does not answer your questions, please call [place] office at [phone] to clarify.   YOU SHOULD EXPECT: Some feelings of bloating in the abdomen. Passage of more gas than usual. Walking can help get rid of the air that was put into your GI tract during the procedure and reduce the bloating. If you had a lower endoscopy (such as a colonoscopy or flexible sigmoidoscopy) you may notice spotting of blood in your stool or on the toilet paper. Some abdominal soreness may be present for a day or two, also.  DIET: Your first meal following the procedure should be a light meal and then it is ok to progress to your normal diet. A half-sandwich or bowl of soup is an example of a good first meal. Heavy or fried foods are harder to digest and may make you feel nauseous or bloated. Drink plenty of fluids but you should avoid alcoholic beverages for 24 hours. If you had a esophageal dilation, please see attached instructions for diet.    ACTIVITY: Your care partner should take you home directly after the procedure. You should plan to take it easy, moving slowly for the rest of the day. You can resume normal activity the day after the procedure however YOU SHOULD NOT DRIVE, use power tools, machinery or perform tasks that involve climbing or major physical exertion for 24 hours (because of the sedation medicines used during the test).   SYMPTOMS TO REPORT IMMEDIATELY: A gastroenterologist can be reached at any hour. Please call [phone]  for any of the following symptoms:   Following upper endoscopy (EGD, EUS, ERCP, esophageal dilation) Vomiting of blood or coffee ground material  New, significant abdominal pain  New, significant chest pain or pain under the shoulder blades  Painful or  persistently difficult swallowing  New shortness of breath  Black, tarry-looking or red, bloody stools  FOLLOW UP:  If any biopsies were taken you will be contacted by phone or by letter within the next 1-3 weeks. Call [phone]  if you have not heard about the biopsies in 3 weeks.  Please also call with any specific questions about appointments or follow up tests.  

## 2019-07-13 NOTE — Anesthesia Procedure Notes (Signed)
Procedure Name: Intubation Date/Time: 07/13/2019 8:10 AM Performed by: Trinna Post., CRNA Pre-anesthesia Checklist: Patient identified, Emergency Drugs available, Suction available, Patient being monitored and Timeout performed Patient Re-evaluated:Patient Re-evaluated prior to induction Oxygen Delivery Method: Circle system utilized Preoxygenation: Pre-oxygenation with 100% oxygen Induction Type: IV induction Ventilation: Mask ventilation without difficulty Laryngoscope Size: Mac and 3 Grade View: Grade I Tube type: Oral Tube size: 7.0 mm Number of attempts: 1 Airway Equipment and Method: Stylet Placement Confirmation: ETT inserted through vocal cords under direct vision,  positive ETCO2 and breath sounds checked- equal and bilateral Secured at: 22 cm Tube secured with: Tape Dental Injury: Teeth and Oropharynx as per pre-operative assessment

## 2019-07-13 NOTE — H&P (Signed)
GASTROENTEROLOGY PROCEDURE H&P NOTE   Primary Care Physician: Annita Brod, MD  HPI: Theresa Kim is a 84 y.o. female who presents for ERCP.  Past Medical History:  Diagnosis Date  . GERD (gastroesophageal reflux disease)   . Rheumatoid arthritis [facility])    Past Surgical History:  Procedure Laterality Date  . BILIARY STENT PLACEMENT  05/30/2019   Procedure: BILIARY STENT PLACEMENT;  Surgeon: Kerin Salen, MD;  Location: [facility] ENDOSCOPY;  Service: Gastroenterology;;  . [place], LAPAROSCOPIC  2018   In [place].  Complicated.  Required drains and courses of antibiotics postoperatively  . ENDOSCOPIC RETROGRADE CHOLANGIOPANCREATOGRAPHY (ERCP) WITH PROPOFOL N/A 05/30/2019   Procedure: ENDOSCOPIC RETROGRADE CHOLANGIOPANCREATOGRAPHY (ERCP) WITH PROPOFOL;  Surgeon: Kerin Salen, MD;  Location: [facility] ENDOSCOPY;  Service: Gastroenterology;  Laterality: N/A;  . EYE SURGERY  03/11/2019  . SPHINCTEROTOMY  05/30/2019   Procedure: SPHINCTEROTOMY;  Surgeon: Kerin Salen, MD;  Location: [facility] ENDOSCOPY;  Service: Gastroenterology;;   Current Facility-Administered Medications  Medication Dose Route Frequency Provider Last Rate Last Admin  . 0.9 %  sodium chloride infusion   Intravenous Continuous Mansouraty, Netty Starring., MD      . lactated ringers infusion   Intravenous Continuous Mansouraty, Netty Starring., MD 20 mL/hr at 07/13/19 0710 New Bag at 07/13/19 0710   No Known Allergies History reviewed. No pertinent family history. Social History   Socioeconomic History  . Marital status: Single    Spouse name: Not on file  . Number of children: Not on file  . Years of education: Not on file  . Highest education level: Not on file  Occupational History  . Not on file  Tobacco Use  . Smoking status: Never Smoker  . Smokeless tobacco: Never Used  Substance and Sexual Activity  . Alcohol use: Never  . Drug use: Never  . Sexual activity: Not on file  Other Topics Concern  . Not on file    Social History Narrative   ** Merged History Encounter **       Social Determinants of Health   Financial Resource Strain:   . Difficulty of Paying Living Expenses: Not on file  Food Insecurity:   . Worried About Programme researcher, broadcasting/film/video in the Last Year: Not on file  . Ran Out of Food in the Last Year: Not on file  Transportation Needs:   . Lack of Transportation (Medical): Not on file  . Lack of Transportation (Non-Medical): Not on file  Physical Activity:   . Days of Exercise per Week: Not on file  . Minutes of Exercise per Session: Not on file  Stress:   . Feeling of Stress : Not on file  Social Connections:   . Frequency of Communication with Friends and Family: Not on file  . Frequency of Social Gatherings with Friends and Family: Not on file  . Attends Religious Services: Not on file  . Active Member of Clubs or Organizations: Not on file  . Attends Banker Meetings: Not on file  . Marital Status: Not on file  Intimate Partner Violence:   . Fear of Current or Ex-Partner: Not on file  . Emotionally Abused: Not on file  . Physically Abused: Not on file  . Sexually Abused: Not on file    Physical Exam: Vital signs in last 24 hours: Temp:  [98.6 F (37 C)] 98.6 F (37 C) (01/04 0701) Pulse Rate:  [66] 66 (01/04 0701) Resp:  [17] 17 (01/04 0701) BP: (177)/(77) 177/77 (01/04 0701) SpO2:  [  97 %] 97 % (01/04 0701)   GEN: NAD EYE: Sclerae anicteric ENT: MMM CV: Non-tachycardic GI: Soft, NT/ND NEURO:  Alert & Oriented x 3  Lab Results: No results for input(s): WBC, HGB, HCT, PLT in the last 72 hours. BMET No results for input(s): NA, K, CL, CO2, GLUCOSE, BUN, CREATININE, CALCIUM in the last 72 hours. LFT No results for input(s): PROT, ALBUMIN, AST, ALT, ALKPHOS, BILITOT, BILIDIR, IBILI in the last 72 hours. PT/INR No results for input(s): LABPROT, INR in the last 72 hours.   Impression / Plan: This is a 84 y.o.female who presents for ERCP.  The  risks of an ERCP were discussed at length, including but not limited to the risk of perforation, bleeding, abdominal pain, post-ERCP pancreatitis (while usually mild can be severe and even life threatening).  The risks and benefits of endoscopic evaluation were discussed with the patient; these include but are not limited to the risk of perforation, infection, bleeding, missed lesions, lack of diagnosis, severe illness requiring hospitalization, as well as anesthesia and sedation related illnesses.  The patient is agreeable to proceed.    Justice Britain, MD [facility] Office # [phone]

## 2019-07-13 NOTE — Telephone Encounter (Signed)
Lab order in Epic.

## 2019-07-13 NOTE — Transfer of Care (Signed)
Immediate Anesthesia Transfer of Care Note  Patient: Theresa Kim  Procedure(s) Performed: ENDOSCOPIC RETROGRADE CHOLANGIOPANCREATOGRAPHY (ERCP) WITH PROPOFOL (N/A ) SPYGLASS CHOLANGIOSCOPY (N/A ) SPYGLASS LITHOTRIPSY (N/A )  Patient Location: PACU  Anesthesia Type:General  Level of Consciousness: awake, alert  and oriented  Airway & Oxygen Therapy: Patient Spontanous Breathing and Patient connected to nasal cannula oxygen  Post-op Assessment: Report given to RN and Post -op Vital signs reviewed and stable  Post vital signs: Reviewed and stable  Last Vitals:  Vitals Value Taken Time  BP    Temp    Pulse    Resp    SpO2      Last Pain:  Vitals:   07/13/19 0701  TempSrc: Oral  PainSc: 0-No pain         Complications: No apparent anesthesia complications

## 2019-07-13 NOTE — Telephone Encounter (Signed)
-----   Message from Lemar Lofty., MD sent at 07/13/2019 10:53 AM EST ----- Sherilyn Cooter, ERCP complete today.  Was able to do a sphincterotomy extension and sphincteroplasty and got the stones out without needing to do EHL. Patient has done well and is being discharged.  Zaeem Kandel, can you please set up a follow-up set of labs (hepatic function panel) in 2 weeks and please place that under Dr. Myrtie Neither' name or myself. Follow-up in clinic with Dr. Myrtie Neither will be dictated by the follow-up labs. Please let me know if I can be of any other assistance. Thanks for the referral.  GM

## 2019-07-13 NOTE — Anesthesia Postprocedure Evaluation (Signed)
Anesthesia Post Note  Patient: Human resources officer  Procedure(s) Performed: ENDOSCOPIC RETROGRADE CHOLANGIOPANCREATOGRAPHY (ERCP) WITH PROPOFOL (N/A ) SPHINCTEROTOMY GASTROINTESTINAL STENT REMOVAL BILIARY DILATION REMOVAL OF STONES     Patient location during evaluation: PACU Anesthesia Type: General Level of consciousness: awake and alert Pain management: pain level controlled Vital Signs Assessment: post-procedure vital signs reviewed and stable Respiratory status: spontaneous breathing, nonlabored ventilation, respiratory function stable and patient connected to nasal cannula oxygen Cardiovascular status: blood pressure returned to baseline and stable Postop Assessment: no apparent nausea or vomiting Anesthetic complications: no    Last Vitals:  Vitals:   07/13/19 1003 07/13/19 1020  BP: (!) 156/75 (!) 153/66  Pulse: 92 75  Resp: 18 (!) 25  Temp:    SpO2: 98% 100%    Last Pain:  Vitals:   07/13/19 1020  TempSrc:   PainSc: 0-No pain                 Cecile Hearing

## 2019-07-13 NOTE — Op Note (Addendum)
[facility] Patient Name: Theresa Kim Procedure Date : 07/13/2019 MRN: [id number] Attending MD: Justice Britain , MD Date of Birth: 09-21-1935 CSN: [id number] Age: 84 Admit Type: Inpatient Procedure:                ERCP Indications:              Bile duct stone(s), Biliary stent removal Providers:                Justice Britain, MD, Carlyn Reichert, RN, Lazaro Arms, Technician Referring MD:             Estill Cotta. Loletha Carrow, MD, Ronnette Juniper, MD Medicines:                General Anesthesia, Cipro 400 mg IV, Indomethacin                            169 mg PR Complications:            No immediate complications. Estimated Blood Loss:     Estimated blood loss was minimal. Procedure:                Pre-Anesthesia Assessment:                           - Prior to the procedure, a History and Physical                            was performed, and patient medications and                            allergies were reviewed. The patient's tolerance of                            previous anesthesia was also reviewed. The risks                            and benefits of the procedure and the sedation                            options and risks were discussed with the patient.                            All questions were answered, and informed consent                            was obtained. Prior Anticoagulants: The patient has                            taken no previous anticoagulant or antiplatelet                            agents. ASA Grade Assessment: III - A patient with  severe systemic disease. After reviewing the risks                            and benefits, the patient was deemed in                            satisfactory condition to undergo the procedure.                           After obtaining informed consent, the scope was                            passed under direct vision. Throughout the        procedure, the patient's blood pressure, pulse, and                            oxygen saturations were monitored continuously. The                            TJF-Q180V ([id number] Olympus duodenoscope was                            introduced through the mouth, and used to inject                            contrast into and used to inject contrast into the                            bile duct. The ERCP was accomplished without                            difficulty. The patient tolerated the procedure. Scope In: Scope Out: Findings:      A scout film of the abdomen was obtained. Surgical clips, consistent       with previous cholecystectomy, were seen in the area of the right upper       quadrant of the abdomen. One stent ending in the main bile duct was seen.      The esophagus was successfully intubated under direct vision without       detailed examination of the pharynx, larynx, and associated structures,       and upper GI tract. The upper GI tract was J-shaped. A biliary       sphincterotomy had been performed. The sphincterotomy appeared open. One       plastic biliary stent originating in the biliary tree was emerging from       the major papilla. The stent was visibly patent. One stent was removed       from the biliary tree using a snare.      A short 0.035 inch Soft Jagwire was passed into the biliary tree. The       Autotome sphincterotome was passed over the guidewire and the bile duct       was then deeply cannulated. Contrast was injected. I personally       interpreted the bile duct images. Ductal flow of contrast was adequate.       Image  quality was adequate. Contrast extended to the hepatic ducts.       Opacification of the entire biliary tree except for the cystic duct and       gallbladder was successful. The main bile duct was moderately dilated       and diffusely dilated. The largest diameter was 18 mm. The main bile       duct contained three stones, the largest  of which was 14 mm in diameter.       The biliary sphincterotomy was extended to a total of 8 mm in length       with a monofilament Autotome sphincterotome using ERBE electrocautery.       There was no post-sphincterotomy bleeding. To discover objects, the       biliary tree was swept with a retrieval balloon starting at the       bifurcation. Sludge was swept from the duct. Two stones were removed.       One stone remained. I lost position due to angulation on multiple       attempts to remove the last stone. Dilation of the distal common bile       duct with an 02-14-09 mm x 5.5 cm CRE balloon (to a maximum balloon size       of 10 mm) dilator was successful as a sphincteroplasty for a total of       4-minutes. The biliary tree was swept with a retrieval balloon starting       at the bifurcation. Sludge was swept from the duct. One stone was       removed. No stones remained. An occlusion cholangiogram was performed       that showed no further significant biliary pathology with good drainage.      A pancreatogram was not performed.      The duodenoscope was withdrawn from the patient. Impression:               - Prior biliary sphincterotomy appeared open.                           - One visibly patent stent from the biliary tree                            was seen in the major papilla. This was removed.                           - The entire main bile duct was moderately dilated.                           - Choledocholithiasis was found. Removal was                            accomplished by biliary sphincterotomy extension,                            biliary sphincteroplasty, balloon sweeping. Recommendation:           - The patient will be observed post-procedure,                            until all discharge criteria are met.                           -  Discharge patient to home.                           - Patient has a contact number available for                             emergencies. The signs and symptoms of potential                            delayed complications were discussed with the                            patient. Return to normal activities tomorrow.                            Written discharge instructions were provided to the                            patient.                           - Low fat diet.                           - Check liver enzymes (AST, ALT, alkaline                            phosphatase, bilirubin) in 2 weeks.                           - Watch for pancreatitis, bleeding, perforation,                            and cholangitis.                           - Minimize NSAID use for the next 1-2 weeks to                            decrease risk of post-sphincterotomy bleeding.                           - Ciprofloxacin 500 mg twice daily for next 3-days                            to decrease risk of post-infectious ERCP                            complications.                           - The findings and recommendations were discussed                            with the patient.                           -  The findings and recommendations were discussed                            with the patient's family. Procedure Code(s):        --- Professional ---                           [phone], Endoscopic retrograde                            cholangiopancreatography (ERCP); with removal of                            foreign body(s) or stent(s) from biliary/pancreatic                            duct(s)                           43264, Endoscopic retrograde                            cholangiopancreatography (ERCP); with removal of                            calculi/debris from biliary/pancreatic duct(s) Diagnosis Code(s):        --- Professional ---                           Z96.89, Presence of other specified functional                            implants                           K80.50, Calculus of bile duct without cholangitis                             or cholecystitis without obstruction                           Z46.59, Encounter for fitting and adjustment of                            other gastrointestinal appliance and device                           K83.8, Other specified diseases of biliary tract CPT copyright 2019 [employer]. All rights reserved. The codes documented in this report are preliminary and upon coder review may  be revised to meet current compliance requirements. Justice Britain, MD 07/13/2019 10:02:22 AM Number of Addenda: 0

## 2019-08-06 ENCOUNTER — Ambulatory Visit: Payer: Medicare PPO

## 2019-08-11 ENCOUNTER — Ambulatory Visit: Payer: Medicare PPO

## 2019-08-15 ENCOUNTER — Ambulatory Visit: Payer: Medicare PPO

## 2019-09-07 NOTE — Progress Notes (Signed)
[facility] Note  09/08/2019     CHIEF COMPLAINT Patient presents for Retina Follow Up   HISTORY OF PRESENT ILLNESS: Theresa Kim is a 84 y.o. female who presents to the clinic today for:   HPI    Retina Follow Up    Patient presents with  Other.  In both eyes.  This started years ago.  Severity is moderate.  Duration of 6 months.  Since onset it is stable.  I, the attending physician,  performed the HPI with the patient and updated documentation appropriately.          Comments    84 y/o female pt here for 6 mo f/u for macular atrophy/Plaquenil toxicity OU.  Pt has been off Plaquenil for about 1.5 yrs.  No change in [place] OU noticed.  Denies pain, flashes, floaters.  Eyes often itch and tear.  No gtts.       Last edited by Bernarda Caffey, MD on 09/08/2019 11:20 AM. (History)    Patient states she has not noticed a change in vision since her last visit, she states she has been off Plaquenil for awhile now, but is on methotrexate and prednisone   Referring physician: Clovia Cuff, MD [place],  [place] [address]  HISTORICAL INFORMATION:   Selected notes from the MEDICAL RECORD NUMBER Referred by Dr. Rutherford Guys for exu ARMD OU   CURRENT MEDICATIONS: No current outpatient medications on file. (Ophthalmic Drugs)   No current facility-administered medications for this visit. (Ophthalmic Drugs)   Current Outpatient Medications (Other)  Medication Sig  . acetaminophen (TYLENOL) 325 MG tablet Take 325-650 mg by mouth every 6 (six) hours as needed (for pain.).  Marland Kitchen folic acid (FOLVITE) 1 MG tablet Take 1 mg by mouth daily.  Marland Kitchen gabapentin (NEURONTIN) 300 MG capsule Take 300 mg by mouth 3 (three) times daily.   . Menthol-Methyl Salicylate (SALONPAS PAIN RELIEF PATCH EX) Place 1 patch onto the skin daily as needed (pain.).  Marland Kitchen methotrexate (RHEUMATREX) 2.5 MG tablet Take 6 tablets (15 mg total) by mouth once a week. Wednesdays (Patient  taking differently: Take 15 mg by mouth every Sunday. )  . omeprazole (PRILOSEC) 20 MG capsule omeprazole 20 mg capsule,delayed release  Take 1 capsule twice a day by oral route.  . predniSONE (DELTASONE) 5 MG tablet Take 5 mg by mouth daily.  . famotidine (PEPCID) 20 MG tablet Take 1 tablet (20 mg total) by mouth daily.   No current facility-administered medications for this visit. (Other)      REVIEW OF SYSTEMS: ROS    Positive for: Gastrointestinal, Musculoskeletal, Eyes   Negative for: Constitutional, Neurological, Skin, Genitourinary, HENT, Endocrine, Cardiovascular, Respiratory, Psychiatric, Allergic/Imm, Heme/Lymph   Last edited by Matthew Folks, COA on 09/08/2019 10:08 AM. (History)       ALLERGIES No Known Allergies  PAST MEDICAL HISTORY Past Medical History:  Diagnosis Date  . GERD (gastroesophageal reflux disease)   . Hypertensive retinopathy    OU  . Rheumatoid arthritis [facility])    Past Surgical History:  Procedure Laterality Date  . BILIARY DILATION  07/13/2019   Procedure: BILIARY DILATION;  Surgeon: Rush Landmark Telford Nab., MD;  Location: [facility] ENDOSCOPY;  Service: Gastroenterology;;  . BILIARY STENT PLACEMENT  05/30/2019   Procedure: BILIARY STENT PLACEMENT;  Surgeon: Ronnette Juniper, MD;  Location: [facility] ENDOSCOPY;  Service: Gastroenterology;;  . CATARACT EXTRACTION Bilateral 2008   Dr. Domenica Fail - [facility]  . CHOLECYSTECTOMY, LAPAROSCOPIC  2018   In [place].  Complicated.  Required drains and courses of antibiotics postoperatively  . ENDOSCOPIC RETROGRADE CHOLANGIOPANCREATOGRAPHY (ERCP) WITH PROPOFOL N/A 05/30/2019   Procedure: ENDOSCOPIC RETROGRADE CHOLANGIOPANCREATOGRAPHY (ERCP) WITH PROPOFOL;  Surgeon: Kerin Salen, MD;  Location: [facility] ENDOSCOPY;  Service: Gastroenterology;  Laterality: N/A;  . ENDOSCOPIC RETROGRADE CHOLANGIOPANCREATOGRAPHY (ERCP) WITH PROPOFOL N/A 07/13/2019   Procedure: ENDOSCOPIC RETROGRADE CHOLANGIOPANCREATOGRAPHY (ERCP) WITH PROPOFOL;  Surgeon:  Meridee Score Netty Starring., MD;  Location: [facility] ENDOSCOPY;  Service: Gastroenterology;  Laterality: N/A;  . EYE SURGERY Bilateral 2008   Cat Sx  . GASTROINTESTINAL STENT REMOVAL  07/13/2019   Procedure: GASTROINTESTINAL STENT REMOVAL;  Surgeon: Lemar Lofty., MD;  Location: [facility] ENDOSCOPY;  Service: Gastroenterology;;  . REMOVAL OF STONES  07/13/2019   Procedure: REMOVAL OF STONES;  Surgeon: Lemar Lofty., MD;  Location: [facility] ENDOSCOPY;  Service: Gastroenterology;;  . Dennison Mascot  05/30/2019   Procedure: Dennison Mascot;  Surgeon: Kerin Salen, MD;  Location: [facility] ENDOSCOPY;  Service: Gastroenterology;;  . Dennison Mascot  07/13/2019   Procedure: SPHINCTEROTOMY;  Surgeon: Lemar Lofty., MD;  Location: [facility] ENDOSCOPY;  Service: Gastroenterology;;    FAMILY HISTORY History reviewed. No pertinent family history.  SOCIAL HISTORY Social History   Tobacco Use  . Smoking status: Never Smoker  . Smokeless tobacco: Never Used  Substance Use Topics  . Alcohol use: Never  . Drug use: Never         OPHTHALMIC EXAM:  Base Eye Exam    Visual Acuity (Snellen - Linear)      Right Left   Dist Datto 20/40 -2 20/50 -   Dist ph [place] NI NI       Tonometry (Tonopen, 10:11 AM)      Right Left   Pressure 13 13       Pupils      Dark Light Shape React APD   Right 4 3 Round Slow None   Left 4 3 Round Slow None       Visual Fields (Counting fingers)      Left Right    Full Full       Extraocular Movement      Right Left    Full, Ortho Full, Ortho       Neuro/Psych    Oriented x3: Yes   Mood/Affect: Normal       Dilation    Both eyes: 1.0% Mydriacyl, 2.5% Phenylephrine @ 10:11 AM        Slit Lamp and Fundus Exam    Slit Lamp Exam      Right Left   Lids/Lashes Dermatochalasis - lower lid, mild telangectasia and MGD Dermatochalasis - lower lid, mild telangectasia and MGD   Conjunctiva/Sclera mild conjunctivochalasis mild conjunctivochalasis   Cornea 1-2 + PEE, well  healed temporal cataract wound, 2-3 + PEE, well healed temporal cataract wound,   Anterior Chamber Deep and quiet, no cell or flare Deep and quiet, no cell or flare   Iris round and dilated round and dilated   Lens multifocal PCIOL, 1+ PCO multifocal PCIOL; tr PCO   Vitreous syneresis, PVD, scattered vitreous condensations, Posterior vitreous detachment syneresis, PVD, scattered vitreous condensations       Fundus Exam      Right Left   Disc mild pallor, + PPA, sharp rim trace pallor, + temp PPA, sharp rim   C/D Ratio 0.5 0.5   Macula Flat, no heme, blunted foveal reflex, inf crescent of RPE atrophy, mild ERM, RPE mottling and clumping, +plaquenil toxicity Flat,  no heme blunted foveal reflex, inf crescent of RPE atrophy, mild ERM, RPE mottling and clumping, +plaquenil toxicity   Vessels mild AV crossing changes, Copper wiring, Vascular attenuation Vascular attenuation, mild Copper wiring   Periphery attached, scattered RPE changes, peripheral drusen attached, scattered RPE changes, peripheral drusen          IMAGING AND PROCEDURES  Imaging and Procedures for @TODAY @  OCT, Retina - OU - Both Eyes       Right Eye Quality was good. Central Foveal Thickness: 242. Progression has been stable. Findings include abnormal foveal contour, no IRF, outer retinal atrophy, retinal drusen , no SRF (Mild ERM).   Left Eye Quality was good. Central Foveal Thickness: 264. Progression has been stable. Findings include no IRF, abnormal foveal contour, retinal drusen , outer retinal atrophy, no SRF.   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 CME - cystoid macular edema. PED - pigment epithelial detachment. IRF - intraretinal fluid. SRF - subretinal fluid. EZ - ellipsoid zone. ERM - epiretinal membrane. ORA - outer retinal  atrophy. ORT - outer retinal tubulation. SRHM - subretinal hyper-reflective material        Fluorescein Angiography Optos (Transit OD)       Right Eye   Progression has been stable. Early phase findings include staining, window defect. Mid/Late phase findings include staining, window defect, leakage (Mild, focal patches of hyperfluorescent leakage -- temporal periphery).   Left Eye   Progression has been stable. Early phase findings include staining, window defect. Mid/Late phase findings include staining, window defect, leakage (Mild, focal patches of hyperfluorescent leakage -- temporal periphery).   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 Retinal macular atrophy  H35.89   3. Retinal edema  H35.81 OCT, Retina - OU - Both Eyes  4. Essential hypertension  I10   5. Hypertensive retinopathy of both eyes  H35.033 Fluorescein Angiography Optos (Transit OD)  6. 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 Pseudophakia of both eyes  Z96.1     1,2. Macular atrophy / Plaquenil toxicity OU  - history of long-term plaquenil use (years) for RA -- unknown dosage, but pt is very petite  - plaquenil was stopped some time ~2019  - exam and OCT show characteristic parafoveal pattern of macular atrophy consistent with plaquenil toxicity -- stable today  - discussed findings and prognosis  - no intervention indicated at this time  -  monitor  - f/u 1 year, DFE, OCT, possible FA  3. No retinal edema on exam or OCT  4,5. Hypertensive retinopathy OU  - discussed importance of tight BP control  - monitor  6,7. Rheumatoid arthritis w/ focal, peripheral chorioretinal inflammation OU  - RA under the expert management of Dr. 12-25-1980, Rheumatology  - currently on po prednisone (5 mg  daily) and on MTX 15mg /wk + folic acid  - repeat FA today (03.02.2021) shows persistent, mild focal patches of peripheral hyperfluorescent leakage OU  - suggests there may be a mild uveitic component (focal peripheral chorioretinal inflammation) to her RA  - no acute changes in therapy recommended at this time as pt is already on MTX and po prednisone  - will send letter to Dr. 05.02.2021 to let her know of these findings of ocular inflammation  - f/u here in 1 year, sooner prn for recheck of focal peripheral chorioretinal inflammation  8. Pseudophakia OU  - s/p CE/multifocal IOL OU (Dr. Nickola Major in [place], [place], 2008)  - beautiful surgeries, doing well  - monitor     Ophthalmic Meds Ordered this visit:  No orders of the defined types were placed in this encounter.      Return in about 1 year (around 09/07/2020) for f/u macular atrophy / Plaquenil toxicity OU, DFE, OCT, FA.  There are no Patient Instructions on file for this visit.   Explained the diagnoses, plan, and follow up with the patient and they expressed understanding.  Patient expressed understanding of the importance of proper follow up care.   This document serves as a record of services personally performed by 11/07/2020, MD, PhD. It was created on their behalf by Karie Chimera, COT an ophthalmic technician. The creation of this record is the provider's dictation and/or activities during the visit.    Electronically signed by: Cristopher Estimable, COT 09/07/19 @ 12:20 PM   This document serves as a record of services personally performed by 11/07/19, MD, PhD. It was  created on their behalf by Karie Chimera, OA, an ophthalmic assistant. The creation of this record is the provider's dictation and/or activities during the visit.    Electronically signed by: Laurian Brim, OA 03.02.2021 12:20 PM  05.02.2021, M.D., Ph.D. Diseases & Surgery of the Retina and Vitreous [facility] 09/08/2019   I have reviewed the above documentation for accuracy and completeness, and I agree with the above. 11/08/2019, M.D., Ph.D. 09/08/19 12:20 PM    Abbreviations: M myopia (nearsighted); A astigmatism; H hyperopia (farsighted); P presbyopia; Mrx spectacle prescription;  CTL contact lenses; OD right eye; OS left eye; OU both eyes  XT exotropia; ET esotropia; PEK punctate epithelial keratitis; PEE punctate epithelial erosions; DES dry eye syndrome; MGD meibomian gland dysfunction; ATs artificial tears; PFAT's preservative free artificial tears; NSC nuclear sclerotic cataract; PSC posterior subcapsular cataract; ERM epi-retinal membrane; PVD posterior vitreous detachment; RD retinal detachment; DM diabetes mellitus; DR diabetic retinopathy; NPDR non-proliferative diabetic retinopathy; PDR proliferative diabetic retinopathy; CSME clinically significant macular edema; DME diabetic macular edema; dbh dot blot hemorrhages; CWS cotton wool spot; POAG primary open angle glaucoma; C/D cup-to-disc ratio; HVF humphrey visual field; GVF goldmann visual field; OCT optical coherence tomography; IOP intraocular pressure; BRVO Branch retinal vein occlusion; CRVO central retinal vein occlusion; CRAO central retinal artery occlusion; BRAO branch retinal artery occlusion; RT retinal tear; SB scleral buckle; PPV pars plana vitrectomy; VH Vitreous hemorrhage; PRP panretinal laser photocoagulation; IVK intravitreal kenalog; VMT vitreomacular traction; MH Macular hole;  NVD neovascularization of the disc; NVE neovascularization elsewhere; AREDS age related eye disease study; ARMD  age related macular degeneration; POAG primary open angle glaucoma; EBMD epithelial/anterior basement membrane dystrophy; ACIOL anterior chamber intraocular lens; IOL intraocular lens; PCIOL posterior chamber intraocular lens; Phaco/IOL phacoemulsification with intraocular lens placement; PRK photorefractive keratectomy; LASIK laser assisted  in situ keratomileusis; HTN hypertension; DM diabetes mellitus; COPD chronic obstructive pulmonary disease

## 2019-09-08 ENCOUNTER — Encounter (INDEPENDENT_AMBULATORY_CARE_PROVIDER_SITE_OTHER): Payer: Self-pay | Admitting: Ophthalmology

## 2019-09-08 ENCOUNTER — Ambulatory Visit (INDEPENDENT_AMBULATORY_CARE_PROVIDER_SITE_OTHER): Payer: Medicare PPO | Admitting: Ophthalmology

## 2019-09-08 DIAGNOSIS — H3581 Retinal edema: Secondary | ICD-10-CM

## 2019-09-08 DIAGNOSIS — H3589 Other specified retinal disorders: Secondary | ICD-10-CM | POA: Diagnosis not present

## 2019-09-08 DIAGNOSIS — H30003 Unspecified focal chorioretinal inflammation, bilateral: Secondary | ICD-10-CM

## 2019-09-08 DIAGNOSIS — H35389 Toxic maculopathy, unspecified eye: Secondary | ICD-10-CM | POA: Diagnosis not present

## 2019-09-08 DIAGNOSIS — I10 Essential (primary) hypertension: Secondary | ICD-10-CM | POA: Diagnosis not present

## 2019-09-08 DIAGNOSIS — M069 Rheumatoid arthritis, unspecified: Secondary | ICD-10-CM

## 2019-09-08 DIAGNOSIS — H35033 Hypertensive retinopathy, bilateral: Secondary | ICD-10-CM

## 2019-09-08 DIAGNOSIS — Z961 Presence of intraocular lens: Secondary | ICD-10-CM

## 2019-09-08 DIAGNOSIS — T372X5A Adverse effect of antimalarials and drugs acting on other blood protozoa, initial encounter: Secondary | ICD-10-CM

## 2020-01-18 ENCOUNTER — Encounter (HOSPITAL_COMMUNITY): Payer: Self-pay

## 2020-01-18 DIAGNOSIS — I214 Non-ST elevation (NSTEMI) myocardial infarction: Principal | ICD-10-CM | POA: Diagnosis present

## 2020-01-18 DIAGNOSIS — G9340 Encephalopathy, unspecified: Secondary | ICD-10-CM | POA: Diagnosis present

## 2020-01-18 DIAGNOSIS — I959 Hypotension, unspecified: Secondary | ICD-10-CM

## 2020-01-18 DIAGNOSIS — J9601 Acute respiratory failure with hypoxia: Secondary | ICD-10-CM | POA: Diagnosis present

## 2020-01-18 DIAGNOSIS — Z20822 Contact with and (suspected) exposure to covid-19: Secondary | ICD-10-CM | POA: Diagnosis present

## 2020-01-18 DIAGNOSIS — Z7952 Long term (current) use of systemic steroids: Secondary | ICD-10-CM

## 2020-01-18 DIAGNOSIS — Y92009 Unspecified place in unspecified non-institutional (private) residence as the place of occurrence of the external cause: Secondary | ICD-10-CM

## 2020-01-18 DIAGNOSIS — R54 Age-related physical debility: Secondary | ICD-10-CM | POA: Diagnosis present

## 2020-01-18 DIAGNOSIS — D72829 Elevated white blood cell count, unspecified: Secondary | ICD-10-CM | POA: Diagnosis present

## 2020-01-18 DIAGNOSIS — J810 Acute pulmonary edema: Secondary | ICD-10-CM | POA: Diagnosis present

## 2020-01-18 DIAGNOSIS — R778 Other specified abnormalities of plasma proteins: Secondary | ICD-10-CM

## 2020-01-18 DIAGNOSIS — I471 Supraventricular tachycardia: Secondary | ICD-10-CM | POA: Diagnosis present

## 2020-01-18 DIAGNOSIS — Z79899 Other long term (current) drug therapy: Secondary | ICD-10-CM

## 2020-01-18 DIAGNOSIS — W19XXXA Unspecified fall, initial encounter: Secondary | ICD-10-CM | POA: Diagnosis present

## 2020-01-18 DIAGNOSIS — G629 Polyneuropathy, unspecified: Secondary | ICD-10-CM | POA: Diagnosis present

## 2020-01-18 DIAGNOSIS — I4510 Unspecified right bundle-branch block: Secondary | ICD-10-CM | POA: Diagnosis present

## 2020-01-18 DIAGNOSIS — Z66 Do not resuscitate: Secondary | ICD-10-CM | POA: Diagnosis present

## 2020-01-18 DIAGNOSIS — I10 Essential (primary) hypertension: Secondary | ICD-10-CM | POA: Diagnosis present

## 2020-01-18 DIAGNOSIS — J9690 Respiratory failure, unspecified, unspecified whether with hypoxia or hypercapnia: Secondary | ICD-10-CM

## 2020-01-18 DIAGNOSIS — S72002A Fracture of unspecified part of neck of left femur, initial encounter for closed fracture: Secondary | ICD-10-CM

## 2020-01-18 DIAGNOSIS — E872 Acidosis: Secondary | ICD-10-CM | POA: Diagnosis present

## 2020-01-18 DIAGNOSIS — R680 Hypothermia, not associated with low environmental temperature: Secondary | ICD-10-CM | POA: Diagnosis present

## 2020-01-18 DIAGNOSIS — M069 Rheumatoid arthritis, unspecified: Secondary | ICD-10-CM | POA: Diagnosis present

## 2020-01-18 DIAGNOSIS — R578 Other shock: Secondary | ICD-10-CM | POA: Diagnosis present

## 2020-01-18 DIAGNOSIS — S72012A Unspecified intracapsular fracture of left femur, initial encounter for closed fracture: Secondary | ICD-10-CM | POA: Diagnosis present

## 2020-01-18 DIAGNOSIS — R000 Tachycardia, unspecified: Secondary | ICD-10-CM

## 2020-01-18 DIAGNOSIS — M349 Systemic sclerosis, unspecified: Secondary | ICD-10-CM | POA: Diagnosis present

## 2020-01-18 MED ORDER — SODIUM CHLORIDE 0.9 % IV SOLN: INTRAVENOUS | Status: DC

## 2020-01-18 NOTE — ED Provider Notes (Signed)
[facility]-EMERGENCY DEPT Provider Note   CSN: [id number] Arrival date & time: 02-03-2020  2325   Time seen 11:38 PM  History No chief complaint on file. Fall at home  Theresa Kim is a 84 y.o. female.  HPI   Patient presents from home.  She states she fell in her bedroom.  She has difficulty answering questions because she was given 100 mcg of fentanyl by EMS in route to the ED and she keeps repeating "I feel sleepy".  She denies hitting her head or having a headache.  She denies neck pain or chest pain or upper extremity pain.  She only complains of pain in her left lower extremity.  Patient presents with nasal cannula oxygen, it does not appear she is on oxygen at home.  PCP Annita Brod, MD   Past Medical History:  Diagnosis Date  . GERD (gastroesophageal reflux disease)   . Hypertensive retinopathy    OU  . Rheumatoid arthritis [facility])     Patient Active Problem List   Diagnosis Date Noted  . Acute hypoxemic respiratory failure (HCC) 01/27/2020  . Acute cholangitis   . Choledocholithiasis 05/29/2019  . Neuropathy 05/29/2019  . 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 Essential hypertension 04/10/2017  . Gastroesophageal reflux disease 04/10/2017  . Depressive disorder 04/10/2017  . Osteoporosis 04/10/2017  . Iron deficiency anemia 04/10/2017  . Muscle weakness 04/10/2017  . Rheumatoid arthritis (HCC) 04/10/2017    Past Surgical History:  Procedure Laterality Date  . BILIARY DILATION  07/13/2019   Procedure: BILIARY DILATION;  Surgeon: Meridee Score Netty Starring., MD;  Location: [facility] ENDOSCOPY;  Service: Gastroenterology;;  . BILIARY STENT PLACEMENT  05/30/2019   Procedure: BILIARY STENT PLACEMENT;  Surgeon: Kerin Salen, MD;  Location: [facility] ENDOSCOPY;  Service: Gastroenterology;;  . CATARACT EXTRACTION Bilateral 2008   Dr. Raphael Gibney - [facility]  . CHOLECYSTECTOMY, LAPAROSCOPIC  2018   In [place].  Complicated.  Required drains and courses of antibiotics  postoperatively  . ENDOSCOPIC RETROGRADE CHOLANGIOPANCREATOGRAPHY (ERCP) WITH PROPOFOL N/A 05/30/2019   Procedure: ENDOSCOPIC RETROGRADE CHOLANGIOPANCREATOGRAPHY (ERCP) WITH PROPOFOL;  Surgeon: Kerin Salen, MD;  Location: [facility] ENDOSCOPY;  Service: Gastroenterology;  Laterality: N/A;  . ENDOSCOPIC RETROGRADE CHOLANGIOPANCREATOGRAPHY (ERCP) WITH PROPOFOL N/A 07/13/2019   Procedure: ENDOSCOPIC RETROGRADE CHOLANGIOPANCREATOGRAPHY (ERCP) WITH PROPOFOL;  Surgeon: Meridee Score Netty Starring., MD;  Location: [facility] ENDOSCOPY;  Service: Gastroenterology;  Laterality: N/A;  . EYE SURGERY Bilateral 2008   Cat Sx  . GASTROINTESTINAL STENT REMOVAL  07/13/2019   Procedure: GASTROINTESTINAL STENT REMOVAL;  Surgeon: Lemar Lofty., MD;  Location: [facility] ENDOSCOPY;  Service: Gastroenterology;;  . REMOVAL OF STONES  07/13/2019   Procedure: REMOVAL OF STONES;  Surgeon: Lemar Lofty., MD;  Location: [facility] ENDOSCOPY;  Service: Gastroenterology;;  . Dennison Mascot  05/30/2019   Procedure: Dennison Mascot;  Surgeon: Kerin Salen, MD;  Location: [facility] ENDOSCOPY;  Service: Gastroenterology;;  . Dennison Mascot  07/13/2019   Procedure: SPHINCTEROTOMY;  Surgeon: Lemar Lofty., MD;  Location: [facility] ENDOSCOPY;  Service: Gastroenterology;;     OB History   No obstetric history on file.     History reviewed. No pertinent family history.  Social History   Tobacco Use  . Smoking status: Never Smoker  . Smokeless tobacco: Never Used  Vaping Use  . Vaping Use: Never used  Substance Use Topics  . Alcohol use: Never  . Drug use: Never  lives with daughter  Home Medications Prior to Admission medications   Medication Sig Start Date End Date Taking? Authorizing Provider  acetaminophen (TYLENOL) 325 MG tablet Take 325-650 mg by mouth every 6 (six) hours as needed (for pain.).   Yes [provider]  doxylamine, Sleep, (UNISOM) 25 MG tablet Take 25 mg by mouth at bedtime as needed for sleep.   Yes [provider]  famotidine (PEPCID) 20 MG tablet Take 1 tablet (20 mg total) by mouth daily. Patient taking differently: Take 20 mg by mouth 2 (two) times daily.  06/02/19 01/13/2020 Yes Brimage, Seward Meth, DO  folic acid (FOLVITE) 1 MG tablet Take 1 mg by mouth daily. 04/28/19  Yes [provider]  gabapentin (NEURONTIN) 300 MG capsule Take 300 mg by mouth 3 (three) times daily.  03/21/19  Yes [provider]  methotrexate (RHEUMATREX) 2.5 MG tablet Take 6 tablets (15 mg total) by mouth once a week. Wednesdays Patient taking differently: Take 15 mg by mouth every Sunday.  06/06/19  Yes Brimage, Vondra, DO  predniSONE (DELTASONE) 5 MG tablet Take 5 mg by mouth daily. 05/12/19  Yes [provider]    Allergies    Patient has no known allergies.  Review of Systems   Review of Systems  All other systems reviewed and are negative.   Physical Exam Updated Vital Signs BP 112/77   Pulse (!) 126   Temp 97.6 F (36.4 C) (Oral)   Resp (!) 34   LMP  (LMP Unknown)   SpO2 95%   Physical Exam Vitals and nursing note reviewed.  Constitutional:      Appearance: Normal appearance.     Comments: Small frail elderly female  HENT:     Head: Normocephalic and atraumatic.     Comments: Head is nontender to palpation    Right Ear: External ear normal.     Left Ear: External ear normal.     Nose: Nose normal.  Eyes:     Extraocular Movements: Extraocular movements intact.     Conjunctiva/sclera: Conjunctivae normal.     Pupils: Pupils are equal, round, and reactive to light.  Cardiovascular:     Rate and Rhythm: Normal rate and regular rhythm.     Pulses: Normal pulses.     Heart sounds: No murmur heard.   Pulmonary:     Effort: Pulmonary effort is normal. No respiratory distress.     Breath sounds: Normal breath sounds.  Chest:     Chest wall: No tenderness.  Abdominal:     General: Abdomen is flat. Bowel sounds are normal.     Palpations: Abdomen is soft.    Musculoskeletal:        General: Deformity present.     Cervical back: Normal range of motion and neck supple. No tenderness.     Comments: Patient is noted to have some external rotation of her left lower leg.  I removed her 2 pairs of socks and she has good distal pulses.  She indicates she has pain in her thigh area on the left.  Skin:    General: Skin is warm and dry.     Comments: Patient has some mottling of her left lower extremity.  Neurological:     General: No focal deficit present.     Mental Status: She is alert and oriented to person, place, and time.     Cranial Nerves: No cranial nerve deficit.  Psychiatric:        Mood and Affect: Affect is flat.        Speech: Speech is delayed.  Behavior: Behavior is slowed.     ED Results / Procedures / Treatments   Labs (all labs ordered are listed, but only abnormal results are displayed) Results for orders placed or performed during the hospital encounter of 01/12/2020  SARS Coronavirus 2 by RT PCR (hospital order, performed in [facility] hospital lab) Nasopharyngeal Nasopharyngeal Swab   Specimen: Nasopharyngeal Swab  Result Value Ref Range   SARS Coronavirus 2 NEGATIVE NEGATIVE  Comprehensive metabolic panel  Result Value Ref Range   Sodium 143 135 - 145 mmol/L   Potassium 3.9 3.5 - 5.1 mmol/L   Chloride 113 (H) 98 - 111 mmol/L   CO2 19 (L) 22 - 32 mmol/L   Glucose, Bld 148 (H) 70 - 99 mg/dL   BUN 20 8 - 23 mg/dL   Creatinine, Ser 4.28 0.44 - 1.00 mg/dL   Calcium 8.5 (L) 8.9 - 10.3 mg/dL   Total Protein 7.2 6.5 - 8.1 g/dL   Albumin 3.7 3.5 - 5.0 g/dL   AST 34 15 - 41 U/L   ALT 28 0 - 44 U/L   Alkaline Phosphatase 67 38 - 126 U/L   Total Bilirubin 0.9 0.3 - 1.2 mg/dL   GFR calc non Af Amer >60 >60 mL/min   GFR calc Af Amer >60 >60 mL/min   Anion gap 11 5 - 15  CBC with Differential  Result Value Ref Range   WBC 23.1 (H) 4.0 - 10.5 K/uL   RBC 5.50 (H) 3.87 - 5.11 MIL/uL   Hemoglobin 17.2 (H) 12.0 - 15.0  g/dL   HCT 76.8 (H) 36 - 46 %   MCV 98.2 80.0 - 100.0 fL   MCH 31.3 26.0 - 34.0 pg   MCHC 31.9 30.0 - 36.0 g/dL   RDW 11.5 72.6 - 20.3 %   Platelets 180 150 - 400 K/uL   nRBC 0.0 0.0 - 0.2 %   Neutrophils Relative % 89 %   Neutro Abs 20.7 (H) 1.7 - 7.7 K/uL   Lymphocytes Relative 6 %   Lymphs Abs 1.4 0.7 - 4.0 K/uL   Monocytes Relative 3 %   Monocytes Absolute 0.6 0 - 1 K/uL   Eosinophils Relative 0 %   Eosinophils Absolute 0.0 0 - 0 K/uL   Basophils Relative 0 %   Basophils Absolute 0.1 0 - 0 K/uL   Immature Granulocytes 2 %   Abs Immature Granulocytes 0.42 (H) 0.00 - 0.07 K/uL  Urinalysis, Routine w reflex microscopic  Result Value Ref Range   Color, Urine AMBER (A) YELLOW   APPearance CLEAR CLEAR   Specific Gravity, Urine 1.026 1.005 - 1.030   pH 5.0 5.0 - 8.0   Glucose, UA NEGATIVE NEGATIVE mg/dL   Hgb urine dipstick NEGATIVE NEGATIVE   Bilirubin Urine NEGATIVE NEGATIVE   Ketones, ur 5 (A) NEGATIVE mg/dL   Protein, ur 30 (A) NEGATIVE mg/dL   Nitrite NEGATIVE NEGATIVE   Leukocytes,Ua NEGATIVE NEGATIVE   RBC / HPF 0-5 0 - 5 RBC/hpf   WBC, UA 0-5 0 - 5 WBC/hpf   Bacteria, UA RARE (A) NONE SEEN   Squamous Epithelial / LPF 0-5 0 - 5   Mucus PRESENT   Protime-INR  Result Value Ref Range   Prothrombin Time 12.4 11.4 - 15.2 seconds   INR 1.0 0.8 - 1.2  APTT  Result Value Ref Range   aPTT 23 (L) 24 - 36 seconds  Brain natriuretic peptide  Result Value Ref Range   B Natriuretic Peptide  146.0 (H) 0.0 - 100.0 pg/mL  Blood gas, venous  Result Value Ref Range   pH, Ven 7.339 7.25 - 7.43   pCO2, Ven 37.8 (L) 44 - 60 mmHg   pO2, Ven 57.8 (H) 32 - 45 mmHg   Bicarbonate 19.8 (L) 20.0 - 28.0 mmol/L   Acid-base deficit 5.0 (H) 0.0 - 2.0 mmol/L   O2 Saturation 86.3 %   Patient temperature 98.6   Magnesium  Result Value Ref Range   Magnesium 2.0 1.7 - 2.4 mg/dL  Sample to Blood Bank  Result Value Ref Range   Blood Bank Specimen SAMPLE AVAILABLE FOR TESTING    Sample  Expiration      01/22/2020,2359 Performed at [facility], [address], [place], [place] [address]   Troponin I (High Sensitivity)  Result Value Ref Range   Troponin I (High Sensitivity) 162 (HH) <18 ng/L  Troponin I (High Sensitivity)  Result Value Ref Range   Troponin I (High Sensitivity) 631 (HH) <18 ng/L   Laboratory interpretation all normal except leukocytosis, polycythemia or elevated hemoglobin    EKG EKG Interpretation  Date/Time:  Monday 02-16-2020 23:42:01 EDT Ventricular Rate:  110 PR Interval:    QRS Duration: 126 QT Interval:  354 QTC Calculation: 479 R Axis:   21 Text Interpretation: Sinus tachycardia LAE, consider biatrial enlargement Right bundle branch block LVH by voltage Inferior infarct, age indeterminate Lateral leads are also involved No significant change since last tracing 29 May 2019 Confirmed by Devoria Albe [phone]) on 02-16-20 11:52:29 PM   Radiology DG Chest 1 View  Result Date: 01/23/2020 CLINICAL DATA:  Initial evaluation for acute trauma, fall, hip fracture. EXAM: CHEST  1 VIEW COMPARISON:  Prior radiograph from 05/29/2019. FINDINGS: Cardiomegaly.  Mediastinal silhouette within normal limits. Lungs hypoinflated. Diffuse pulmonary vascular and interstitial prominence, suggesting a degree of pulmonary interstitial edema, although this finding is likely at least in part extension weighted by shallow lung inflation. No visible pleural effusion. Superimposed bibasilar atelectatic changes. No definite focal infiltrates. No pneumothorax. No acute osseous finding. IMPRESSION: 1. Cardiomegaly with diffuse pulmonary vascular and interstitial prominence, suggesting a pulmonary interstitial edema. 2. Low lung volumes with superimposed bibasilar atelectatic changes. Electronically Signed   By: Rise Mu M.D.   On: 01/31/2020 00:31   DG Hip Unilat W or Wo Pelvis 2-3 Views Left  Result Date: 01/24/2020 CLINICAL DATA:  Initial  evaluation for acute trauma, fall. EXAM: DG HIP (WITH OR WITHOUT PELVIS) 2-3V LEFT COMPARISON:  None. FINDINGS: There is an acute subcapital fracture extending through the left femoral neck with slight superior subluxation. Femoral head remains normally aligned within the acetabulum. Femoral head height maintained. Bony pelvis intact. Possible remotely healed fractures noted at the left superior and inferior pubic rami. Limited views of the right hip demonstrate no acute abnormality. Underlying osteopenia noted. No visible soft tissue injury. Degenerative changes noted within the lower lumbar spine. IMPRESSION: Acute subcapital fracture of the left femoral neck with slight superior subluxation. Electronically Signed   By: Rise Mu M.D.   On: 02/03/2020 00:28   DG Femur Min 2 Views Left  Result Date: 01/10/2020 CLINICAL DATA:  Initial evaluation for acute trauma, fall, hip fracture. EXAM: LEFT FEMUR 2 VIEWS COMPARISON:  Concomitant radiograph of the left hip. FINDINGS: Previously identified subcapital fracture of the left femoral neck not included on this exam. No other acute fracture dislocation seen about the left femur. Osteoarthritic changes noted at the knee. Underlying osteopenia noted.  No visible soft tissue abnormality. IMPRESSION: 1. No acute osseous abnormality about the left femur. 2. Previously identified subcapital fracture of the left femoral neck is not included on this exam. Electronically Signed   By: Rise Mu M.D.   On: January 20, 2020 00:32    Procedures .Critical Care Performed by: Devoria Albe, MD Authorized by: Devoria Albe, MD   Critical care provider statement:    Critical care time (minutes):  50   Critical care was necessary to treat or prevent imminent or life-threatening deterioration of the following conditions:  Cardiac failure, respiratory failure and shock   Critical care was time spent personally by me on the following activities:  Discussions with  consultants, examination of patient, obtaining history from patient or surrogate, ordering and review of laboratory studies, ordering and review of radiographic studies, pulse oximetry, re-evaluation of patient's condition and review of old charts   (including critical care time)  Medications Ordered in ED Medications  0.9 %  sodium chloride infusion ( Intravenous Not Given 01/20/2020 0133)  amiodarone (NEXTERONE) 1.8 mg/mL load via infusion 150 mg (150 mg Intravenous Bolus from Bag 2020-01-20 0412)    Followed by  amiodarone (NEXTERONE PREMIX) 360-4.14 MG/200ML-% (1.8 mg/mL) IV infusion (60 mg/hr Intravenous New Bag/Given 20-Jan-2020 0411)    Followed by  amiodarone (NEXTERONE PREMIX) 360-4.14 MG/200ML-% (1.8 mg/mL) IV infusion (has no administration in time range)  methylPREDNISolone sodium succinate (SOLU-MEDROL) 125 mg/2 mL injection 60 mg (60 mg Intravenous Given 2020/01/20 0624)  0.9 %  sodium chloride infusion (has no administration in time range)  piperacillin-tazobactam (ZOSYN) IVPB 3.375 g (has no administration in time range)  vancomycin (VANCOCIN) IVPB 1000 mg/200 mL premix (has no administration in time range)  docusate sodium (COLACE) capsule 100 mg (has no administration in time range)  polyethylene glycol (MIRALAX / GLYCOLAX) packet 17 g (has no administration in time range)  enoxaparin (LOVENOX) injection 40 mg (has no administration in time range)  acetaminophen (TYLENOL) tablet 650 mg (has no administration in time range)  ondansetron (ZOFRAN) injection 4 mg (has no administration in time range)  pantoprazole (PROTONIX) injection 40 mg (has no administration in time range)  0.9 %  sodium chloride infusion (has no administration in time range)  norepinephrine (LEVOPHED)  in premix infusion (has no administration in time range)  furosemide (LASIX) injection 60 mg (60 mg Intravenous Given 01/20/20 0126)  albuterol (PROVENTIL,VENTOLIN) solution continuous neb (10 mg/hr Nebulization  Given Jan 20, 2020 0203)  PHENYLephrine 40 mcg/ml in normal saline Adult IV Push Syringe (For Blood Pressure Support) (120 mcg Intravenous Given 01-20-2020 0401)    ED Course  I have reviewed the triage vital signs and the nursing notes.  Pertinent labs & imaging results that were available during my care of the patient were reviewed by me and considered in my medical decision making (see chart for details).    MDM Rules/Calculators/A&P                         X-rays were obtained of her left hip and chest.  Laboratory testing was started for presumed left hip fracture.   12:40 AM patient's daughter is in the room.  She states patient was fine all day.  We looked at her x-ray and I discussed with her that she where her fracture is she will need a hip replacement set of a pin and plating.  Daughter states that she did have a fractured arm about 3 years ago but  that was treated in [place].  She does not have a local orthopedist.  Patient's not on oxygen at home.  I had looked at her chest x-ray and thought her x-ray looks like some failure, she states patient has never had any problem like that.  Patient appears to be tachypneic now and is noted to have significant tachycardia.  When I went back to my room radiology had read her films and they are also calling her cardiomegaly with diffuse pulmonary vascular and interstitial prominence suggesting interstitial edema.  I had already ordered a BNP and troponin to her previous lab work.  I am going to go ahead and give her some Lasix.  1:30AM called to see patient for decreased level consciousness, increasing respiratory distress and increasing heart rate up to 130.  Nurse notes that on her nasal cannula oxygen she dropped her oxygen into the high 80s.  She is currently on a nonrebreather mask.  She appears to be tachypneic and when I listen to her she now has wheezing.  Radiology has read her x-ray and feels like she has pulmonary edema which the daughter states  she is never had before.  I am questioning whether she is having a narcotic pulmonary edema.  The nurse had just given her Lasix.  BiPAP was ordered and continuous albuterol nebulizer was ordered.  Recheck 1:50 AM respiratory therapy is just placed the patient on BiPAP.  Her respiratory rate went down from 35-28.  He is also getting ready to start her on the albuterol continuous nebulizer.  Recheck at 2:40 AM patient's heart rate now is up to 144, repeat EKG was ordered to make sure she is not in atrial flutter.  Her respiratory rate is 28-29.  When I listen to her now she has improved air movement and I am not hearing wheezing.  She is starting to have diuresis and her Foley catheter.  VBG was ordered.  Daughter still at bedside.  We discussed that she is improving.  I will be able to talk to the hospitalist about admission soon.  Nurse reports patient's blood pressure is dropping.  Her VBG has returned and her pH looks fine.  Repeat EKG was done and it still looks like supraventricular tachycardia with rate 141.  Due to her hypotension she was started on amnio bolus and drip to try to control her heart rate.  She was given a push dose of Neo-Synephrine for her hypotension which will hopefully help or at least not worsen her tachycardia.  Patient's mottling has now extended to her thighs.  04:00 Dr Arsenio Loader, PCCM, will put on list  4:15 AM I went in the room and patient had just gotten her Neo-Synephrine push dose and her amiodarone bolus and starting her drip.  Her blood pressure was 101/68 with heart rate 135, pulse ox 94% on BiPAP.  Respiratory rate was 33.  As I was talking to her daughter within 5 minutes her blood pressure improved to 107/73 and her heart rate was 129.  I talked to the daughter about if they had discussed her wishes as far as resuscitation, daughter states she has signed DNR papers and does not want to be on ventilator.  She is not sure about CPR however the daughter is in the room  and she is her medical POA.  I also discussed with the daughter her initial troponin was very high, the repeat in 2 hours will give [place] more information.  She states her mother had not  complained of any chest pain when they were at home.  Hopefully the troponin elevation is from her tachycardia.  06:15 AM Critical Care has seen patient, RT is placing an art line. Critical Care is going to admit.    Final Clinical Impression(s) / ED Diagnoses Final diagnoses:  Closed fracture of neck of left femur, initial encounter (HCC)  Acute pulmonary edema (HCC)  Hypotension, unspecified hypotension type  Tachycardia  Elevated troponin    Rx / DC Orders  Plan admission  Devoria Albe, MD, Concha Pyo, MD 01/31/2020 [phone]

## 2020-01-18 NOTE — ED Triage Notes (Signed)
Pt was satting in the low 70's per EMS and placed on oxygen, no prior oxygen use

## 2020-01-18 NOTE — ED Triage Notes (Signed)
Pt comes from home and had an unwitnessed fall, normally ambulatory on her own, but does have a walker, EMS reports some shortening and rotation on left side 100 mcgs of fentanyl given She is alert and oriented to her normal

## 2020-01-19 ENCOUNTER — Emergency Department (HOSPITAL_COMMUNITY): Payer: Medicare PPO

## 2020-01-19 ENCOUNTER — Inpatient Hospital Stay (HOSPITAL_COMMUNITY): Payer: Medicare PPO

## 2020-01-19 DIAGNOSIS — Z79899 Other long term (current) drug therapy: Secondary | ICD-10-CM | POA: Diagnosis not present

## 2020-01-19 DIAGNOSIS — E872 Acidosis: Secondary | ICD-10-CM | POA: Diagnosis present

## 2020-01-19 DIAGNOSIS — M349 Systemic sclerosis, unspecified: Secondary | ICD-10-CM | POA: Diagnosis present

## 2020-01-19 DIAGNOSIS — S72012A Unspecified intracapsular fracture of left femur, initial encounter for closed fracture: Secondary | ICD-10-CM | POA: Diagnosis present

## 2020-01-19 DIAGNOSIS — I471 Supraventricular tachycardia: Secondary | ICD-10-CM | POA: Diagnosis present

## 2020-01-19 DIAGNOSIS — R680 Hypothermia, not associated with low environmental temperature: Secondary | ICD-10-CM | POA: Diagnosis present

## 2020-01-19 DIAGNOSIS — Y92009 Unspecified place in unspecified non-institutional (private) residence as the place of occurrence of the external cause: Secondary | ICD-10-CM | POA: Diagnosis not present

## 2020-01-19 DIAGNOSIS — I959 Hypotension, unspecified: Secondary | ICD-10-CM | POA: Diagnosis present

## 2020-01-19 DIAGNOSIS — I214 Non-ST elevation (NSTEMI) myocardial infarction: Secondary | ICD-10-CM | POA: Diagnosis present

## 2020-01-19 DIAGNOSIS — I4510 Unspecified right bundle-branch block: Secondary | ICD-10-CM | POA: Diagnosis present

## 2020-01-19 DIAGNOSIS — R578 Other shock: Secondary | ICD-10-CM | POA: Diagnosis present

## 2020-01-19 DIAGNOSIS — Z20822 Contact with and (suspected) exposure to covid-19: Secondary | ICD-10-CM | POA: Diagnosis present

## 2020-01-19 DIAGNOSIS — G629 Polyneuropathy, unspecified: Secondary | ICD-10-CM | POA: Diagnosis present

## 2020-01-19 DIAGNOSIS — J9601 Acute respiratory failure with hypoxia: Secondary | ICD-10-CM | POA: Diagnosis present

## 2020-01-19 DIAGNOSIS — Z66 Do not resuscitate: Secondary | ICD-10-CM | POA: Diagnosis present

## 2020-01-19 DIAGNOSIS — R54 Age-related physical debility: Secondary | ICD-10-CM | POA: Diagnosis present

## 2020-01-19 DIAGNOSIS — I10 Essential (primary) hypertension: Secondary | ICD-10-CM | POA: Diagnosis present

## 2020-01-19 DIAGNOSIS — G9340 Encephalopathy, unspecified: Secondary | ICD-10-CM | POA: Diagnosis present

## 2020-01-19 DIAGNOSIS — J810 Acute pulmonary edema: Secondary | ICD-10-CM | POA: Diagnosis present

## 2020-01-19 DIAGNOSIS — Z7952 Long term (current) use of systemic steroids: Secondary | ICD-10-CM | POA: Diagnosis not present

## 2020-01-19 DIAGNOSIS — M069 Rheumatoid arthritis, unspecified: Secondary | ICD-10-CM | POA: Diagnosis present

## 2020-01-19 DIAGNOSIS — W19XXXA Unspecified fall, initial encounter: Secondary | ICD-10-CM | POA: Diagnosis present

## 2020-01-19 DIAGNOSIS — D72829 Elevated white blood cell count, unspecified: Secondary | ICD-10-CM | POA: Diagnosis present

## 2020-01-19 LAB — COMPREHENSIVE METABOLIC PANEL
ALT: 28 U/L (ref 0–44)
ALT: 45 U/L — ABNORMAL HIGH (ref 0–44)
AST: 34 U/L (ref 15–41)
AST: 67 U/L — ABNORMAL HIGH (ref 15–41)
Albumin: 3.7 g/dL (ref 3.5–5.0)
Albumin: 3.4 g/dL — ABNORMAL LOW (ref 3.5–5.0)
Alkaline Phosphatase: 67 U/L (ref 38–126)
Alkaline Phosphatase: 79 U/L (ref 38–126)
Anion gap: 11 (ref 5–15)
Anion gap: 20 — ABNORMAL HIGH (ref 5–15)
BUN: 20 mg/dL (ref 8–23)
BUN: 23 mg/dL (ref 8–23)
CO2: 19 mmol/L — ABNORMAL LOW (ref 22–32)
CO2: 19 mmol/L — ABNORMAL LOW (ref 22–32)
Calcium: 8.5 mg/dL — ABNORMAL LOW (ref 8.9–10.3)
Calcium: 8.8 mg/dL — ABNORMAL LOW (ref 8.9–10.3)
Chloride: 113 mmol/L — ABNORMAL HIGH (ref 98–111)
Chloride: 107 mmol/L (ref 98–111)
Creatinine, Ser: 0.78 mg/dL (ref 0.44–1.00)
Creatinine, Ser: 1.53 mg/dL — ABNORMAL HIGH (ref 0.44–1.00)
GFR calc Af Amer: >60 mL/min (ref >60)
GFR calc Af Amer: 37 mL/min — ABNORMAL LOW (ref >60)
GFR calc non Af Amer: >60 mL/min (ref >60)
GFR calc non Af Amer: 32 mL/min — ABNORMAL LOW (ref >60)
Glucose, Bld: 148 mg/dL — ABNORMAL HIGH (ref 70–99)
Glucose, Bld: 265 mg/dL — ABNORMAL HIGH (ref 70–99)
Potassium: 3.9 mmol/L (ref 3.5–5.1)
Potassium: 3.4 mmol/L — ABNORMAL LOW (ref 3.5–5.1)
Sodium: 143 mmol/L (ref 135–145)
Sodium: 146 mmol/L — ABNORMAL HIGH (ref 135–145)
Total Bilirubin: 0.9 mg/dL (ref 0.3–1.2)
Total Bilirubin: 0.8 mg/dL (ref 0.3–1.2)
Total Protein: 7.2 g/dL (ref 6.5–8.1)
Total Protein: 6.9 g/dL (ref 6.5–8.1)

## 2020-01-19 LAB — HEMOGLOBIN A1C
Hgb A1c MFr Bld: 5.7 % — ABNORMAL HIGH (ref 4.8–5.6)
Mean Plasma Glucose: 116.89 mg/dL

## 2020-01-19 LAB — CBC WITH DIFFERENTIAL/PLATELET
Abs Immature Granulocytes: 0.42 10*3/uL — ABNORMAL HIGH (ref 0.00–0.07)
Basophils Absolute: 0.1 10*3/uL (ref 0.0–0.1)
Basophils Relative: 0 %
Eosinophils Absolute: 0 10*3/uL (ref 0.0–0.5)
Eosinophils Relative: 0 %
HCT: 54 % — ABNORMAL HIGH (ref 36.0–46.0)
Hemoglobin: 17.2 g/dL — ABNORMAL HIGH (ref 12.0–15.0)
Immature Granulocytes: 2 %
Lymphocytes Relative: 6 %
Lymphs Abs: 1.4 10*3/uL (ref 0.7–4.0)
MCH: 31.3 pg (ref 26.0–34.0)
MCHC: 31.9 g/dL (ref 30.0–36.0)
MCV: 98.2 fL (ref 80.0–100.0)
Monocytes Absolute: 0.6 10*3/uL (ref 0.1–1.0)
Monocytes Relative: 3 %
Neutro Abs: 20.7 10*3/uL — ABNORMAL HIGH (ref 1.7–7.7)
Neutrophils Relative %: 89 %
Platelets: 180 10*3/uL (ref 150–400)
RBC: 5.5 MIL/uL — ABNORMAL HIGH (ref 3.87–5.11)
RDW: 15.5 % (ref 11.5–15.5)
WBC: 23.1 10*3/uL — ABNORMAL HIGH (ref 4.0–10.5)
nRBC: 0 % (ref 0.0–0.2)

## 2020-01-19 LAB — BRAIN NATRIURETIC PEPTIDE: B Natriuretic Peptide: 146 pg/mL — ABNORMAL HIGH (ref 0.0–100.0)

## 2020-01-19 LAB — BLOOD GAS, VENOUS
Acid-base deficit: 5 mmol/L — ABNORMAL HIGH (ref 0.0–2.0)
Bicarbonate: 19.8 mmol/L — ABNORMAL LOW (ref 20.0–28.0)
O2 Saturation: 86.3 %
Patient temperature: 98.6
pCO2, Ven: 37.8 mmHg — ABNORMAL LOW (ref 44.0–60.0)
pH, Ven: 7.339 (ref 7.250–7.430)
pO2, Ven: 57.8 mmHg — ABNORMAL HIGH (ref 32.0–45.0)

## 2020-01-19 LAB — PROTIME-INR
INR: 1 (ref 0.8–1.2)
Prothrombin Time: 12.4 seconds (ref 11.4–15.2)

## 2020-01-19 LAB — BLOOD GAS, ARTERIAL
Acid-base deficit: 8.2 mmol/L — ABNORMAL HIGH (ref 0.0–2.0)
Bicarbonate: 15.8 mmol/L — ABNORMAL LOW (ref 20.0–28.0)
FIO2: 80
O2 Saturation: 94.4 %
Patient temperature: 97.6
pCO2 arterial: 29.4 mmHg — ABNORMAL LOW (ref 32.0–48.0)
pH, Arterial: 7.346 — ABNORMAL LOW (ref 7.350–7.450)
pO2, Arterial: 77.4 mmHg — ABNORMAL LOW (ref 83.0–108.0)

## 2020-01-19 LAB — URINALYSIS, ROUTINE W REFLEX MICROSCOPIC
Bilirubin Urine: NEGATIVE
Glucose, UA: NEGATIVE mg/dL
Hgb urine dipstick: NEGATIVE
Ketones, ur: 5 mg/dL — AB
Leukocytes,Ua: NEGATIVE
Nitrite: NEGATIVE
Protein, ur: 30 mg/dL — AB
Specific Gravity, Urine: 1.026 (ref 1.005–1.030)
pH: 5 (ref 5.0–8.0)

## 2020-01-19 LAB — TROPONIN I (HIGH SENSITIVITY)
Troponin I (High Sensitivity): 162 ng/L (ref <18)
Troponin I (High Sensitivity): 631 ng/L (ref <18)
Troponin I (High Sensitivity): 1369 ng/L (ref <18)

## 2020-01-19 LAB — LACTIC ACID, PLASMA
Lactic Acid, Venous: 5 mmol/L (ref 0.5–1.9)
Lactic Acid, Venous: 8.5 mmol/L (ref 0.5–1.9)

## 2020-01-19 LAB — STREP PNEUMONIAE URINARY ANTIGEN: Strep Pneumo Urinary Antigen: NEGATIVE

## 2020-01-19 LAB — APTT: aPTT: 23 seconds — ABNORMAL LOW (ref 24–36)

## 2020-01-19 LAB — SAMPLE TO BLOOD BANK

## 2020-01-19 LAB — PROCALCITONIN: Procalcitonin: 22.07 ng/mL

## 2020-01-19 LAB — MAGNESIUM: Magnesium: 2 mg/dL (ref 1.7–2.4)

## 2020-01-19 LAB — D-DIMER, QUANTITATIVE: D-Dimer, Quant: >20 ug/mL-FEU — ABNORMAL HIGH (ref 0.00–0.50)

## 2020-01-19 LAB — SARS CORONAVIRUS 2 BY RT PCR (HOSPITAL ORDER, PERFORMED IN ~~LOC~~ HOSPITAL LAB): SARS Coronavirus 2: NEGATIVE

## 2020-01-19 LAB — GLUCOSE, CAPILLARY: Glucose-Capillary: 393 mg/dL — ABNORMAL HIGH (ref 70–99)

## 2020-01-19 MED ORDER — NOREPINEPHRINE 4 MG/250ML-% IV SOLN
2.0000 ug/min | INTRAVENOUS | Status: DC
  Administered 2020-01-19: 10 ug/min via INTRAVENOUS
  Filled 2020-01-19: qty 250

## 2020-01-19 MED ORDER — AMIODARONE HCL IN DEXTROSE 360-4.14 MG/200ML-% IV SOLN: 30.0000 mg/h | INTRAVENOUS | Status: DC

## 2020-01-19 MED ORDER — INSULIN ASPART 100 UNIT/ML ~~LOC~~ SOLN
0.0000 [IU] | SUBCUTANEOUS | Status: DC
  Administered 2020-01-19: 9 [IU] via SUBCUTANEOUS
  Filled 2020-01-19: qty 0.09

## 2020-01-19 MED ORDER — VANCOMYCIN HCL IN DEXTROSE 1-5 GM/200ML-% IV SOLN
1000.0000 mg | INTRAVENOUS | Status: DC
  Administered 2020-01-19: 1000 mg via INTRAVENOUS
  Filled 2020-01-19: qty 200

## 2020-01-19 MED ORDER — PHENYLEPHRINE 40 MCG/ML (10ML) SYRINGE FOR IV PUSH (FOR BLOOD PRESSURE SUPPORT)
120.0000 ug | PREFILLED_SYRINGE | Freq: Once | INTRAVENOUS | Status: AC | PRN
  Administered 2020-01-19: 120 ug via INTRAVENOUS
  Filled 2020-01-19: qty 10

## 2020-01-19 MED ORDER — PANTOPRAZOLE SODIUM 40 MG IV SOLR: 40.0000 mg | Freq: Every day | INTRAVENOUS | Status: DC

## 2020-01-19 MED ORDER — ENOXAPARIN SODIUM 40 MG/0.4ML ~~LOC~~ SOLN: 40.0000 mg | SUBCUTANEOUS | Status: DC

## 2020-01-19 MED ORDER — METHYLPREDNISOLONE SODIUM SUCC 125 MG IJ SOLR
60.0000 mg | Freq: Two times a day (BID) | INTRAMUSCULAR | Status: DC
  Administered 2020-01-19: 60 mg via INTRAVENOUS
  Filled 2020-01-19: qty 2

## 2020-01-19 MED ORDER — FUROSEMIDE 10 MG/ML IJ SOLN: 40.0000 mg | Freq: Once | INTRAMUSCULAR | Status: DC

## 2020-01-19 MED ORDER — ACETAMINOPHEN 325 MG PO TABS: 650.0000 mg | ORAL_TABLET | ORAL | Status: DC | PRN

## 2020-01-19 MED ORDER — DOCUSATE SODIUM 100 MG PO CAPS: 100.0000 mg | ORAL_CAPSULE | Freq: Two times a day (BID) | ORAL | Status: DC | PRN

## 2020-01-19 MED ORDER — SODIUM CHLORIDE 0.9 % IV SOLN: 250.0000 mL | INTRAVENOUS | Status: DC

## 2020-01-19 MED ORDER — POLYETHYLENE GLYCOL 3350 17 G PO PACK: 17.0000 g | PACK | Freq: Every day | ORAL | Status: DC | PRN

## 2020-01-19 MED ORDER — ONDANSETRON HCL 4 MG/2ML IJ SOLN: 4.0000 mg | Freq: Four times a day (QID) | INTRAMUSCULAR | Status: DC | PRN

## 2020-01-19 MED ORDER — AMIODARONE HCL IN DEXTROSE 360-4.14 MG/200ML-% IV SOLN
60.0000 mg/h | INTRAVENOUS | Status: DC
  Administered 2020-01-19: 60 mg/h via INTRAVENOUS
  Filled 2020-01-19: qty 200

## 2020-01-19 MED ORDER — PIPERACILLIN-TAZOBACTAM 3.375 G IVPB
3.3750 g | Freq: Three times a day (TID) | INTRAVENOUS | Status: DC
  Administered 2020-01-19: 3.375 g via INTRAVENOUS
  Filled 2020-01-19: qty 50

## 2020-01-19 MED ORDER — ALBUMIN HUMAN 25 % IV SOLN
INTRAVENOUS | Status: AC
  Filled 2020-01-19: qty 50

## 2020-01-19 MED ORDER — FUROSEMIDE 10 MG/ML IJ SOLN
60.0000 mg | Freq: Once | INTRAMUSCULAR | Status: AC
  Administered 2020-01-19: 60 mg via INTRAVENOUS
  Filled 2020-01-19: qty 8

## 2020-01-19 MED ORDER — ALBUTEROL (5 MG/ML) CONTINUOUS INHALATION SOLN
10.0000 mg/h | INHALATION_SOLUTION | Freq: Once | RESPIRATORY_TRACT | Status: AC
  Administered 2020-01-19: 10 mg/h via RESPIRATORY_TRACT
  Filled 2020-01-19: qty 20

## 2020-01-19 MED ORDER — SODIUM CHLORIDE 0.9 % IV SOLN: INTRAVENOUS | Status: DC | PRN

## 2020-01-19 MED ORDER — AMIODARONE LOAD VIA INFUSION
150.0000 mg | Freq: Once | INTRAVENOUS | Status: AC
  Administered 2020-01-19: 150 mg via INTRAVENOUS
  Filled 2020-01-19: qty 83.34

## 2020-01-20 LAB — LEGIONELLA PNEUMOPHILA SEROGP 1 UR AG: L. pneumophila Serogp 1 Ur Ag: NEGATIVE

## 2020-01-24 LAB — CULTURE, BLOOD (ROUTINE X 2)
Culture: NO GROWTH
Culture: NO GROWTH
Special Requests: ADEQUATE
Special Requests: ADEQUATE

## 2020-02-07 NOTE — Consult Note (Addendum)
NAMEAleria Kim, MRN:  [id number], DOB:  [birth year], LOS: 0 ADMISSION DATE:  01/17/2020, CONSULTATION DATE:  2020/02/14 REFERRING MD:  Dr. Lynelle Doctor, ED, CHIEF COMPLAINT:  Hip pain after fall, feeling sleepy  Brief History   Theresa Kim is an 84 year old woman with scleroderma, RA, hx of cholelithialisis/cholecystectomy/biliary stent/sphincterotomy, HTN, hx of arm fracture, here after an unwitnessed fall resulting in a hip fracture; found to be increasingly lethargic, hypoxemic, tachycardic and tachypneic. Increasing troponin, cold extremities.    History of present illness   History obtained from daughter Theresa Kim.   Patient was apparently in normal state of health prior to fall.  At baseline she has some dyspnea on exertion, as well as a chronic cough.   Seen normal at 8pm for dinner.  At 10pm daughter and son in law heard noise and found she had fallen. Patient was a bit confused but awake, unable to give a history of how she fell.    In the ED, was lethargic, thought to be 2/2 fentanyl received PTA.   Mental status progressively worsened.  Labs were generally unremarkable except for an elevated troponin, elevated WBC Increasing oxygen requirements and tachypnea --> albuterol given for wheezing.   Increasing tachycardia noted.  Started on Amiodarone. CXR c/w possible CHF - lasix 60mg  given.  +UOP.  Bipap --> some mild improvement in tachypnea and oxygenation.   Intermittent hypotension also noted. Neo x 1.     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   Significant Hospital Events     Consults:    Procedures:  Arterial line placement   Significant Diagnostic Tests:  VBG 7.339/37.8/57 CMP essentially nl (NAGMA HCO3 19).  BNP only 146, Trop 631 WBC 23.1 Micro Data:  Bl cultures ordered UA negative for signs of infection  Antimicrobials:  Zosyn, vanc  Interim history/subjective:    Objective   Blood pressure (!) 90/48, pulse (!) 141, temperature 97.6 F (36.4 C), temperature source Oral, resp. rate (!) 32, SpO2 93 %.    FiO2 (%):  [80 %] 80 %  No intake or output data in the 24 hours ending Feb 14, 2020 0412 There were no vitals filed for this visit.  Examination: General: Lethargic, only flutters eyes open to sternal rub. On Bipap HENT: NCAT, perrl Lungs: B rhonchi diffusely  Cardiovascular: no mgr, tachycardic Abdomen: NT, ND, NBS Extremities: Cool, mottled, diaphoretic Neuro: non responsive aside from minimal eye opening on sternal rub.    Resolved Hospital Problem list     Assessment & Plan:  Acute encephalopathy: unclear cause. Possibly 2/2 shock.   Lactate pending, repeat cmp pending.   Starting empiric antibiotics.   CT head when stable enough (would be mostly for prognostics(   Hypoxemic resp failure: Possibly cardiogenic, CHF, vs pneumonia. WBC 23, but no signs of infection clinically prior to fall.  Patient tends to be very stoic, however.   No fever but actually hypothermic.  Takes low dose steroids chronically.  Tx for possible wheezing (undx asthma vs COPD?) methylpred 60mg  BID. duonebs prn.  Procal pending.  Empiric abtx.  Given lasix already, will follow urine output.   Stat echo pending.  Repeat ABG.    Hypotension, shock: cool mottles extremities suggest cardiogenic shock.  Bedside echo not satisfactory, poor windows.  From what I can see, RV appears functional and normal in size.  LV dilated, unable to make out EF.  Only limited apical 4 chamber view available to me.   Repeat trop  pending.   D dimer pending.  Echo pending.  Levophed as needed. Avoid phenylephrine given possible cardiac component to this.  Consider CTA if able to tolerate.  Art line placed.    NSTEMI: trop elevation.  Possibly demand vs tako tsubo vs plaque rupture.    Tachycardia: appears to be sinus tach to me.  Will hold amiodarone for now.    Hip fracture: will need orthoconsult when more stable.    Best practice:  Diet: NPO Pain/Anxiety/Delirium protocol (if indicated): VAP protocol (if indicated): DVT prophylaxis lovenox GI prophylaxis: ppi Glucose control: iss Mobility: bedrest Code Status: DNR DNI Family Communication: spoke with Theresa Kim, her daughter, extensively  Disposition:   Labs   CBC: Recent Labs  Lab 01/27/2020 0007  WBC 23.1*  NEUTROABS 20.7*  HGB 17.2*  HCT 54.0*  MCV 98.2  PLT 180    Basic Metabolic Panel: Recent Labs  Lab 01/10/2020 0007  NA 143  K 3.9  CL 113*  CO2 19*  GLUCOSE 148*  BUN 20  CREATININE 0.78  CALCIUM 8.5*   GFR: CrCl cannot be calculated (Unknown ideal weight.). Recent Labs  Lab 02/01/2020 0007  WBC 23.1*    Liver Function Tests: Recent Labs  Lab 01/14/2020 0007  AST 34  ALT 28  ALKPHOS 67  BILITOT 0.9  PROT 7.2  ALBUMIN 3.7   No results for input(s): LIPASE, AMYLASE in the last 168 hours. No results for input(s): AMMONIA in the last 168 hours.  ABG    Component Value Date/Time   HCO3 19.8 (L) 01/07/2020 0319   ACIDBASEDEF 5.0 (H) 02/06/2020 0319   O2SAT 86.3 01/09/2020 0319     Coagulation Profile: Recent Labs  Lab 01/08/2020 0007  INR 1.0    Cardiac Enzymes: No results for input(s): CKTOTAL, CKMB, CKMBINDEX, TROPONINI in the last 168 hours.  HbA1C: No results found for: HGBA1C  CBG: No results for input(s): GLUCAP in the last 168 hours.  Review of Systems:   Unable to assess  Past Medical History  She,  has a past medical history of GERD (gastroesophageal reflux disease), Hypertensive retinopathy, and  Rheumatoid arthritis (HCC).   Surgical History    Past Surgical History:  Procedure Laterality Date  . BILIARY DILATION  07/13/2019   Procedure: BILIARY DILATION;  Surgeon: Meridee Score Netty Starring., MD;  Location: [facility] ENDOSCOPY;  Service: Gastroenterology;;  . BILIARY STENT PLACEMENT  05/30/2019   Procedure: BILIARY STENT PLACEMENT;  Surgeon: Kerin Salen, MD;  Location: [facility] ENDOSCOPY;  Service: Gastroenterology;;  . CATARACT EXTRACTION Bilateral 2008   Dr. Raphael Gibney - [facility]  . CHOLECYSTECTOMY, LAPAROSCOPIC  2018   In [place].  Complicated.  Required drains and courses of antibiotics postoperatively  . ENDOSCOPIC RETROGRADE CHOLANGIOPANCREATOGRAPHY (ERCP) WITH PROPOFOL N/A 05/30/2019   Procedure: ENDOSCOPIC RETROGRADE CHOLANGIOPANCREATOGRAPHY (ERCP) WITH PROPOFOL;  Surgeon: Kerin Salen, MD;  Location: [facility] ENDOSCOPY;  Service: Gastroenterology;  Laterality: N/A;  . ENDOSCOPIC RETROGRADE CHOLANGIOPANCREATOGRAPHY (ERCP) WITH PROPOFOL N/A 07/13/2019   Procedure: ENDOSCOPIC RETROGRADE CHOLANGIOPANCREATOGRAPHY (ERCP) WITH PROPOFOL;  Surgeon: Meridee Score Netty Starring., MD;  Location: [facility] ENDOSCOPY;  Service: Gastroenterology;  Laterality:  N/A;  . EYE SURGERY Bilateral 2008   Cat Sx  . GASTROINTESTINAL STENT REMOVAL  07/13/2019   Procedure: GASTROINTESTINAL STENT REMOVAL;  Surgeon: Lemar Lofty., MD;  Location: [facility] ENDOSCOPY;  Service: Gastroenterology;;  . REMOVAL OF STONES  07/13/2019   Procedure: REMOVAL OF STONES;  Surgeon: Lemar Lofty., MD;  Location: [facility] ENDOSCOPY;  Service: Gastroenterology;;  . Dennison Mascot  05/30/2019   Procedure: SPHINCTEROTOMY;  Surgeon: Kerin Salen, MD;  Location: [facility] ENDOSCOPY;  Service: Gastroenterology;;  . Dennison Mascot  07/13/2019   Procedure: SPHINCTEROTOMY;  Surgeon: Mansouraty, Netty Starring., MD;  Location: [facility] ENDOSCOPY;  Service: Gastroenterology;;     Social History   reports that she has never smoked. She has never used smokeless tobacco. She reports that  she does not drink alcohol and does not use drugs.   Family History   Her family history is not on file.   Allergies No Known Allergies   Home Medications  Prior to Admission medications   Medication Sig Start Date End Date Taking? Authorizing Provider  acetaminophen (TYLENOL) 325 MG tablet Take 325-650 mg by mouth every 6 (six) hours as needed (for pain.).    [provider]  famotidine (PEPCID) 20 MG tablet Take 1 tablet (20 mg total) by mouth daily. 06/02/19 07/02/19  Katha Cabal, DO  folic acid (FOLVITE) 1 MG tablet Take 1 mg by mouth daily. 04/28/19   [provider]  gabapentin (NEURONTIN) 300 MG capsule Take 300 mg by mouth 3 (three) times daily.  03/21/19   [provider]  Menthol-Methyl Salicylate (SALONPAS PAIN RELIEF PATCH EX) Place 1 patch onto the skin daily as needed (pain.).    [provider]  methotrexate (RHEUMATREX) 2.5 MG tablet Take 6 tablets (15 mg total) by mouth once a week. Wednesdays Patient taking differently: Take 15 mg by mouth every Sunday.  06/06/19   Katha Cabal, DO  omeprazole (PRILOSEC) 20 MG capsule omeprazole 20 mg capsule,delayed release  Take 1 capsule twice a day by oral route.    [provider]  predniSONE (DELTASONE) 5 MG tablet Take 5 mg by mouth daily. 05/12/19   [provider]     Critical care time: 60 minutes

## 2020-02-07 NOTE — Progress Notes (Signed)
Increased work of breathing  Pulmonary congestion, likely pulmonary edema  Order chest x-ray, ABG  Lasix 40 mg IV x1

## 2020-02-07 NOTE — Progress Notes (Signed)
NAMEWoodrow Kim, MRN:  [id number], DOB:  1936/06/14, LOS: 0 ADMISSION DATE:  02-05-2020, CONSULTATION DATE: 01/14/2020 REFERRING MD: Dr. Lynelle Doctor, ED, CHIEF COMPLAINT: Hip pain after fall, feeling sleepy  Brief History   Ms. Theresa Kim is an 84 year old woman with scleroderma, RA, hx of cholelithialisis/cholecystectomy/biliary stent/sphincterotomy, HTN, hx of arm fracture, here after an unwitnessed fall resulting in a hip fracture; found to be increasingly lethargic, hypoxemic, tachycardic and tachypneic. Increasing troponin, cold extremities.    History of present illness   History obtained from daughter Byrd Hesselbach.   Patient was apparently in normal state of health prior to fall.  At baseline she has some dyspnea on exertion, as well as a chronic cough.   Seen normal at 8pm for dinner.  At 10pm daughter and son in law heard noise and found she had fallen. Patient was a bit confused but awake, unable to give a history of how she fell.    In the ED, was lethargic, thought to be 2/2 fentanyl received PTA.   Mental status progressively worsened.  Labs were generally unremarkable except for an elevated troponin, elevated WBC Increasing oxygen requirements and tachypnea --> albuterol given for wheezing.   Increasing tachycardia noted.  Started on Amiodarone. CXR c/w possible CHF - lasix 60mg  given.  +UOP.  Bipap --> some mild improvement in tachypnea and oxygenation.   Intermittent hypotension also noted. Neo x 1.    Past Medical History   Past Medical History:  Diagnosis Date  . GERD (gastroesophageal reflux disease)   . Hypertensive retinopathy    OU  . Rheumatoid arthritis (HCC)    Significant Hospital Events   Encephalopathic Requiring BiPAP  Consults:  pccm  Procedures:  Arterial line placement  Significant Diagnostic Tests:  VBG 7.339/37.8/57 CMP essentially nl (NAGMA HCO3 19).  BNP only 146, Trop 631 WBC 23.1  Micro Data:  Blood cultures 7/13 UA negative for  signs of infection  Antimicrobials:  Zosyn, vancomycin 7/13>>  Interim history/subjective:  Not arousable, mottled extremities  Objective   Blood pressure 99/66, pulse (!) 112, temperature 97.6 F (36.4 C), temperature source Oral, resp. rate (!) 36, weight 51.1 kg, SpO2 91 %.    FiO2 (%):  [80 %] 80 %   Intake/Output Summary (Last 24 hours) at 01/29/2020 1003 Last data filed at 01/31/2020 01/21/2020 Gross per 24 hour  Intake 268.94 ml  Output 285 ml  Net -16.06 ml   Filed Weights   01/24/2020 0930  Weight: 51.1 kg    Examination: General: Elderly lady, frail, HENT: BiPAP mask in place Lungs: Rhonchi bilaterally Cardiovascular: S1-S2 appreciated Abdomen: Bowel sounds appreciated Extremities: No clubbing, no edema Neuro: Obtunded GU:   Resolved Hospital Problem list     Assessment & Plan:  Acute encephalopathy Possibly secondary to shock -Empiric antibiotics -We will go ahead and obtain CT scan of the head -Lactate is elevated, leukocytosis -There is concern for sepsis however was not sick prior to her fall -Will continue antibiotics  Hypoxemic respiratory failure -Continue BiPAP support  Severe rheumatoid arthritis -Chronically on prednisone  Hypotension/shock with mottled extremities -Bedside echo report as per Dr. 01/21/20 -Complete echo ordered -May require pressors  Non-ST elevation MI -This troponin elevation -Will obtain CT scan of the head prior to initiation of anticoagulation  Tachycardia  Hip fracture -We will require further evaluation -We will consult Ortho when more stable  I did reconfirm with patient's daughter was at bedside regarding goals of care Patient is DNR No  intubation Continue lines of care to give her the best chance of improvement   Best practice:  Diet: N.p.o. Pain/Anxiety/Delirium protocol (if indicated): As needed VAP protocol (if indicated): Not indicated DVT prophylaxis: Lovenox GI prophylaxis: PPI Glucose control:  SSI Mobility: Bedrest Code Status: DNR Family Communication: Discussed extensively with daughter at bedside Disposition:   Labs   CBC: Recent Labs  Lab 01/20/2020 0007  WBC 23.1*  NEUTROABS 20.7*  HGB 17.2*  HCT 54.0*  MCV 98.2  PLT 180    Basic Metabolic Panel: Recent Labs  Lab 02/05/2020 0007 02/04/2020 0119 01/13/2020 0624  NA 143  --  146*  K 3.9  --  3.4*  CL 113*  --  107  CO2 19*  --  19*  GLUCOSE 148*  --  265*  BUN 20  --  23  CREATININE 0.78  --  1.53*  CALCIUM 8.5*  --  8.8*  MG  --  2.0  --    GFR: CrCl cannot be calculated (Unknown ideal weight.). Recent Labs  Lab 01/23/2020 0007 01/26/2020 0624 01/14/2020 0902  PROCALCITON  --  22.07  --   WBC 23.1*  --   --   LATICACIDVEN  --  5.0* 8.5*    Liver Function Tests: Recent Labs  Lab 01/17/2020 0007 01/23/2020 0624  AST 34 67*  ALT 28 45*  ALKPHOS 67 79  BILITOT 0.9 0.8  PROT 7.2 6.9  ALBUMIN 3.7 3.4*   No results for input(s): LIPASE, AMYLASE in the last 168 hours. No results for input(s): AMMONIA in the last 168 hours.  ABG    Component Value Date/Time   PHART 7.346 (L) 01/28/2020 0600   PCO2ART 29.4 (L) 01/07/2020 0600   PO2ART 77.4 (L) 01/10/2020 0600   HCO3 15.8 (L) 01/25/2020 0600   ACIDBASEDEF 8.2 (H) 01/09/2020 0600   O2SAT 94.4 01/29/2020 0600     Coagulation Profile: Recent Labs  Lab 02/05/2020 0007  INR 1.0    Cardiac Enzymes: No results for input(s): CKTOTAL, CKMB, CKMBINDEX, TROPONINI in the last 168 hours.  HbA1C: No results found for: HGBA1C  CBG: No results for input(s): GLUCAP in the last 168 hours.  Review of Systems:   Unable to provide history  Past Medical History  She,  has a past medical history of GERD (gastroesophageal reflux disease), Hypertensive retinopathy, and Rheumatoid arthritis (HCC).   Surgical History    Past Surgical History:  Procedure Laterality Date  . BILIARY DILATION  07/13/2019   Procedure: BILIARY DILATION;  Surgeon: Meridee Score Netty Starring., MD;  Location: [facility] ENDOSCOPY;  Service: Gastroenterology;;  . BILIARY STENT PLACEMENT  05/30/2019   Procedure: BILIARY STENT PLACEMENT;  Surgeon: Kerin Salen, MD;  Location: [facility] ENDOSCOPY;  Service: Gastroenterology;;  . CATARACT EXTRACTION Bilateral 2008   Dr. Raphael Gibney - [facility]  . CHOLECYSTECTOMY, LAPAROSCOPIC  2018   In [place].  Complicated.  Required drains and courses of antibiotics postoperatively  . ENDOSCOPIC RETROGRADE CHOLANGIOPANCREATOGRAPHY (ERCP) WITH PROPOFOL N/A 05/30/2019   Procedure: ENDOSCOPIC RETROGRADE CHOLANGIOPANCREATOGRAPHY (ERCP) WITH PROPOFOL;  Surgeon: Kerin Salen, MD;  Location: [facility] ENDOSCOPY;  Service: Gastroenterology;  Laterality: N/A;  . ENDOSCOPIC RETROGRADE CHOLANGIOPANCREATOGRAPHY (ERCP) WITH PROPOFOL N/A 07/13/2019   Procedure: ENDOSCOPIC RETROGRADE CHOLANGIOPANCREATOGRAPHY (ERCP) WITH PROPOFOL;  Surgeon: Meridee Score Netty Starring., MD;  Location: [facility] ENDOSCOPY;  Service: Gastroenterology;  Laterality: N/A;  . EYE SURGERY Bilateral 2008   Cat Sx  . GASTROINTESTINAL STENT REMOVAL  07/13/2019   Procedure: GASTROINTESTINAL STENT REMOVAL;  Surgeon: Corliss Parish  Montez Hageman., MD;  Location: [facility] ENDOSCOPY;  Service: Gastroenterology;;  . REMOVAL OF STONES  07/13/2019   Procedure: REMOVAL OF STONES;  Surgeon: Lemar Lofty., MD;  Location: [facility] ENDOSCOPY;  Service: Gastroenterology;;  . Dennison Mascot  05/30/2019   Procedure: Dennison Mascot;  Surgeon: Kerin Salen, MD;  Location: [facility] ENDOSCOPY;  Service: Gastroenterology;;  . Dennison Mascot  07/13/2019   Procedure: SPHINCTEROTOMY;  Surgeon: Lemar Lofty., MD;  Location: [facility] ENDOSCOPY;  Service: Gastroenterology;;     Social History   reports that she has never smoked. She has never used smokeless tobacco. She reports that she does not drink alcohol and does not use drugs.   Family History   Her family history is not on file.   Allergies No Known Allergies   Additional critical care time 31 minutes  Virl Diamond, MD [facility] Pager: [phone]

## 2020-02-07 NOTE — Death Summary Note (Signed)
DEATH SUMMARY   Patient Details  Name: Theresa Kim MRN: [id number] DOB: 09/05/1935  Admission/Discharge Information   Admit Date:  25-Jan-2020  Date of Death: Date of Death: 2020-01-26  Time of Death: Time of Death: 10-30-1051  Length of Stay: 0  Referring Physician: Annita Brod, MD   Reason(s) for Hospitalization  Patient was admitted to the hospital following a fall at home  Diagnoses  Preliminary cause of death: Myocardial infarction Secondary Diagnoses (including complications and co-morbidities):  Active Problems:   Acute hypoxemic respiratory failure (HCC) Rheumatoid arthritis  Scleroderma Hypertension Multiple compression fractures  Brief Hospital Course (including significant findings, care, treatment, and services provided and events leading to death)  Theresa Kim is a 84 y.o. year old female who was brought into the hospital following a fall at home She could not get up by herself, had to be helped to a sitting position.  Did not hit her head, complains only of hip pain on the left. Sleepy when she got to the ED, evaluation did reveal fracture of left femoral head, elevated lactic acid, leukocytosis. Remained obtunded, ABG did not reveal CO2 retention.  Rising troponin, no ST elevation on EKG. Hemodynamics did remain stable Suddenly developed hypotension from which she could not be resuscitated, started on peripheral Levophed, albumin ordered Continued to deteriorate Patient is known to be DNR, DNI  She succumbed to her illness at 25 on 01-26-20    Pertinent Labs and Studies  Significant Diagnostic Studies DG Chest 1 View  Result Date: 26-Jan-2020 CLINICAL DATA:  Initial evaluation for acute trauma, fall, hip fracture. EXAM: CHEST  1 VIEW COMPARISON:  Prior radiograph from 05/29/2019. FINDINGS: Cardiomegaly.  Mediastinal silhouette within normal limits. Lungs hypoinflated. Diffuse pulmonary vascular and interstitial prominence, suggesting a degree of  pulmonary interstitial edema, although this finding is likely at least in part extension weighted by shallow lung inflation. No visible pleural effusion. Superimposed bibasilar atelectatic changes. No definite focal infiltrates. No pneumothorax. No acute osseous finding. IMPRESSION: 1. Cardiomegaly with diffuse pulmonary vascular and interstitial prominence, suggesting a pulmonary interstitial edema. 2. Low lung volumes with superimposed bibasilar atelectatic changes. Electronically Signed   By: Rise Mu M.D.   On: 26-Jan-2020 00:31   DG Hip Unilat W or Wo Pelvis 2-3 Views Left  Result Date: 26-Jan-2020 CLINICAL DATA:  Initial evaluation for acute trauma, fall. EXAM: DG HIP (WITH OR WITHOUT PELVIS) 2-3V LEFT COMPARISON:  None. FINDINGS: There is an acute subcapital fracture extending through the left femoral neck with slight superior subluxation. Femoral head remains normally aligned within the acetabulum. Femoral head height maintained. Bony pelvis intact. Possible remotely healed fractures noted at the left superior and inferior pubic rami. Limited views of the right hip demonstrate no acute abnormality. Underlying osteopenia noted. No visible soft tissue injury. Degenerative changes noted within the lower lumbar spine. IMPRESSION: Acute subcapital fracture of the left femoral neck with slight superior subluxation. Electronically Signed   By: Rise Mu M.D.   On: 01-26-20 00:28   DG Femur Min 2 Views Left  Result Date: 2020-01-26 CLINICAL DATA:  Initial evaluation for acute trauma, fall, hip fracture. EXAM: LEFT FEMUR 2 VIEWS COMPARISON:  Concomitant radiograph of the left hip. FINDINGS: Previously identified subcapital fracture of the left femoral neck not included on this exam. No other acute fracture dislocation seen about the left femur. Osteoarthritic changes noted at the knee. Underlying osteopenia noted. No visible soft tissue abnormality. IMPRESSION: 1. No acute osseous  abnormality about the left femur. 2.  Previously identified subcapital fracture of the left femoral neck is not included on this exam. Electronically Signed   By: Rise Mu M.D.   On: 01/25/20 00:32    Microbiology Recent Results (from the past 240 hour(s))  SARS Coronavirus 2 by RT PCR (hospital order, performed in [facility] hospital lab) Nasopharyngeal Nasopharyngeal Swab     Status: None   Collection Time: 25-Jan-2020 12:07 AM   Specimen: Nasopharyngeal Swab  Result Value Ref Range Status   SARS Coronavirus 2 NEGATIVE NEGATIVE Final    Comment: (NOTE) SARS-CoV-2 target nucleic acids are NOT DETECTED.  The SARS-CoV-2 RNA is generally detectable in upper and lower respiratory specimens during the acute phase of infection. The lowest concentration of SARS-CoV-2 viral copies this assay can detect is 250 copies / mL. A negative result does not preclude SARS-CoV-2 infection and should not be used as the sole basis for treatment or other patient management decisions.  A negative result may occur with improper specimen collection / handling, submission of specimen other than nasopharyngeal swab, presence of viral mutation(s) within the areas targeted by this assay, and inadequate number of viral copies (<250 copies / mL). A negative result must be combined with clinical observations, patient history, and epidemiological information.  Fact Sheet for Patients:   BoilerBrush.com.cy  Fact Sheet for Healthcare Providers: https://pope.com/  This test is not yet approved or  cleared by the [place] FDA and has been authorized for detection and/or diagnosis of SARS-CoV-2 by FDA under an Emergency Use Authorization (EUA).  This EUA will remain in effect (meaning this test can be used) for the duration of the COVID-19 declaration under Section 564(b)(1) of the Act, 21 U.S.C. section 360bbb-3(b)(1), unless the authorization is  terminated or revoked sooner.  Performed at [facility], [address], [place], [place] [address]   Culture, blood (routine x 2)     Status: None (Preliminary result)   Collection Time: 01-25-20  6:24 AM   Specimen: BLOOD LEFT FOREARM  Result Value Ref Range Status   Specimen Description   Final    BLOOD LEFT FOREARM Performed at [facility], [address] [place], [place] [address]    Special Requests   Final    BOTTLES DRAWN AEROBIC AND ANAEROBIC Blood Culture adequate volume Performed at [facility], [address], [place], [place] [address]    Culture PENDING  Incomplete   Report Status PENDING  Incomplete    Lab Basic Metabolic Panel: Recent Labs  Lab 25-Jan-2020 0007 2020-01-25 0119 01/25/2020 0624  NA 143  --  146*  K 3.9  --  3.4*  CL 113*  --  107  CO2 19*  --  19*  GLUCOSE 148*  --  265*  BUN 20  --  23  CREATININE 0.78  --  1.53*  CALCIUM 8.5*  --  8.8*  MG  --  2.0  --    Liver Function Tests: Recent Labs  Lab Jan 25, 2020 0007 Jan 25, 2020 0624  AST 34 67*  ALT 28 45*  ALKPHOS 67 79  BILITOT 0.9 0.8  PROT 7.2 6.9  ALBUMIN 3.7 3.4*   No results for input(s): LIPASE, AMYLASE in the last 168 hours. No results for input(s): AMMONIA in the last 168 hours. CBC: Recent Labs  Lab January 25, 2020 0007  WBC 23.1*  NEUTROABS 20.7*  HGB 17.2*  HCT 54.0*  MCV 98.2  PLT 180   Cardiac Enzymes: No results for input(s): CKTOTAL,  CKMB, CKMBINDEX, TROPONINI in the last 168 hours. Sepsis Labs: Recent Labs  Lab 02-18-20 0007 02-18-20 0624 February 18, 2020 0902  PROCALCITON  --  22.07  --   WBC 23.1*  --   --   LATICACIDVEN  --  5.0* 8.5*    Procedures/Operations  none    Milea Klink A Meily Glowacki 02/18/20, 11:43 AM

## 2020-02-07 NOTE — Progress Notes (Addendum)
1044: Pt's blood pressure now in the 60-70s. Levophed started as ordered. HR also decreasing now in the 100s and pt appears to have worsening mottling. Dr. Wynona Neat called to bedside. Called pt's daughter to update of pt's worsening condition. Per pt's daughter, will go back to hospital ASAP.   1048: Pt's heart rate rapidly decreasing, now in the 50s. SBP in the 40s. Levophed increased to 20 mcg/min and Albumin 25% given as ordered by Dr. Wynona Neat  1053: Pt now in asystole with no breath or heart sounds, and no blood pressure. Pronounced as expired by Ramon Dredge RN and Luciana Axe RN. EKG strip printed at 1053 showing asystole.  Family notified of events upon arrival to unit and was appreciative of care given to pt.

## 2020-02-07 NOTE — Procedures (Signed)
Arterial Catheter Insertion Procedure Note  Theresa Kim  [id number]  August 22, 1935  Date:January 22, 2020  Time:6:43 AM    Provider Performing: Rulon Eisenmenger    Procedure: Insertion of Arterial Line (02585) without US guidance  Indication(s) Blood pressure monitoring and/or need for frequent ABGs  Consent Risks of the procedure as well as the alternatives and risks of each were explained to the patient and/or caregiver.  Consent for the procedure was obtained and is signed in the bedside chart  Anesthesia None   Time Out Verified patient identification, verified procedure, site/side was marked, verified correct patient position, special equipment/implants available, medications/allergies/relevant history reviewed, required imaging and test results available.   Sterile Technique Maximal sterile technique including full sterile barrier drape, hand hygiene, sterile gown, sterile gloves, mask, hair covering, sterile ultrasound probe cover (if used).   Procedure Description Area of catheter insertion was cleaned with chlorhexidine and draped in sterile fashion. Without real-time ultrasound guidance an arterial catheter was placed into the right radial artery.  Appropriate arterial tracings confirmed on monitor.     Complications/Tolerance None; patient tolerated the procedure well.   EBL Minimal   Specimen(s) None

## 2020-02-07 NOTE — ED Notes (Signed)
Provider at bedside, will trsprt when finished

## 2020-02-07 NOTE — Progress Notes (Signed)
Order placed for an ABG. RT walked in the room and the Pt's BP(Aline) was very and was continually dropping. Family were called and were on their way. Pt passed at 10:53. Bipap and aline were discontinued.

## 2020-02-07 NOTE — ED Notes (Signed)
Date and time results received: 02-10-20 4:10 AM  (use smartphrase ".now" to insert current time)  Test: troponin Critical Value: 631  Name of Provider Notified: Devoria Albe, MD  Orders Received? Or Actions Taken?:

## 2020-02-07 NOTE — ED Notes (Signed)
Admitting Provider notified at bedside of critical Lab values Trop 1369 and Lactic 5.0

## 2020-02-07 DEATH — deceased

## 2021-01-05 IMAGING — CR DG HIP (WITH OR WITHOUT PELVIS) 2-3V*L*
3 series · 3 of 3 positions shown · non-contrast
Comparison: None.

CLINICAL DATA: Initial evaluation for acute trauma, fall.

EXAM:
DG HIP (WITH OR WITHOUT PELVIS) 2-3V LEFT

[w hip lat left]
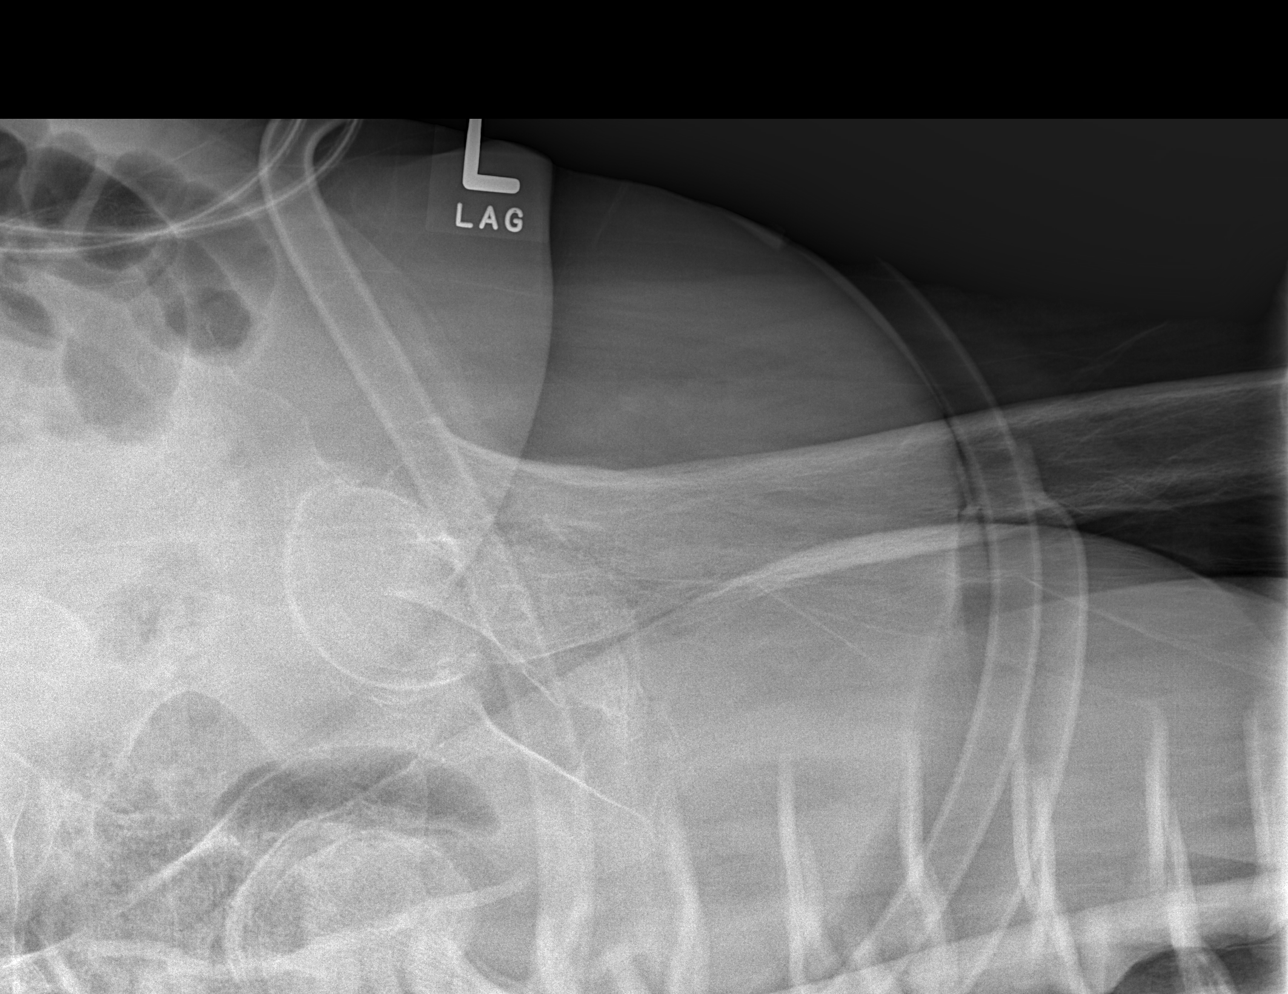

[x pelvis]
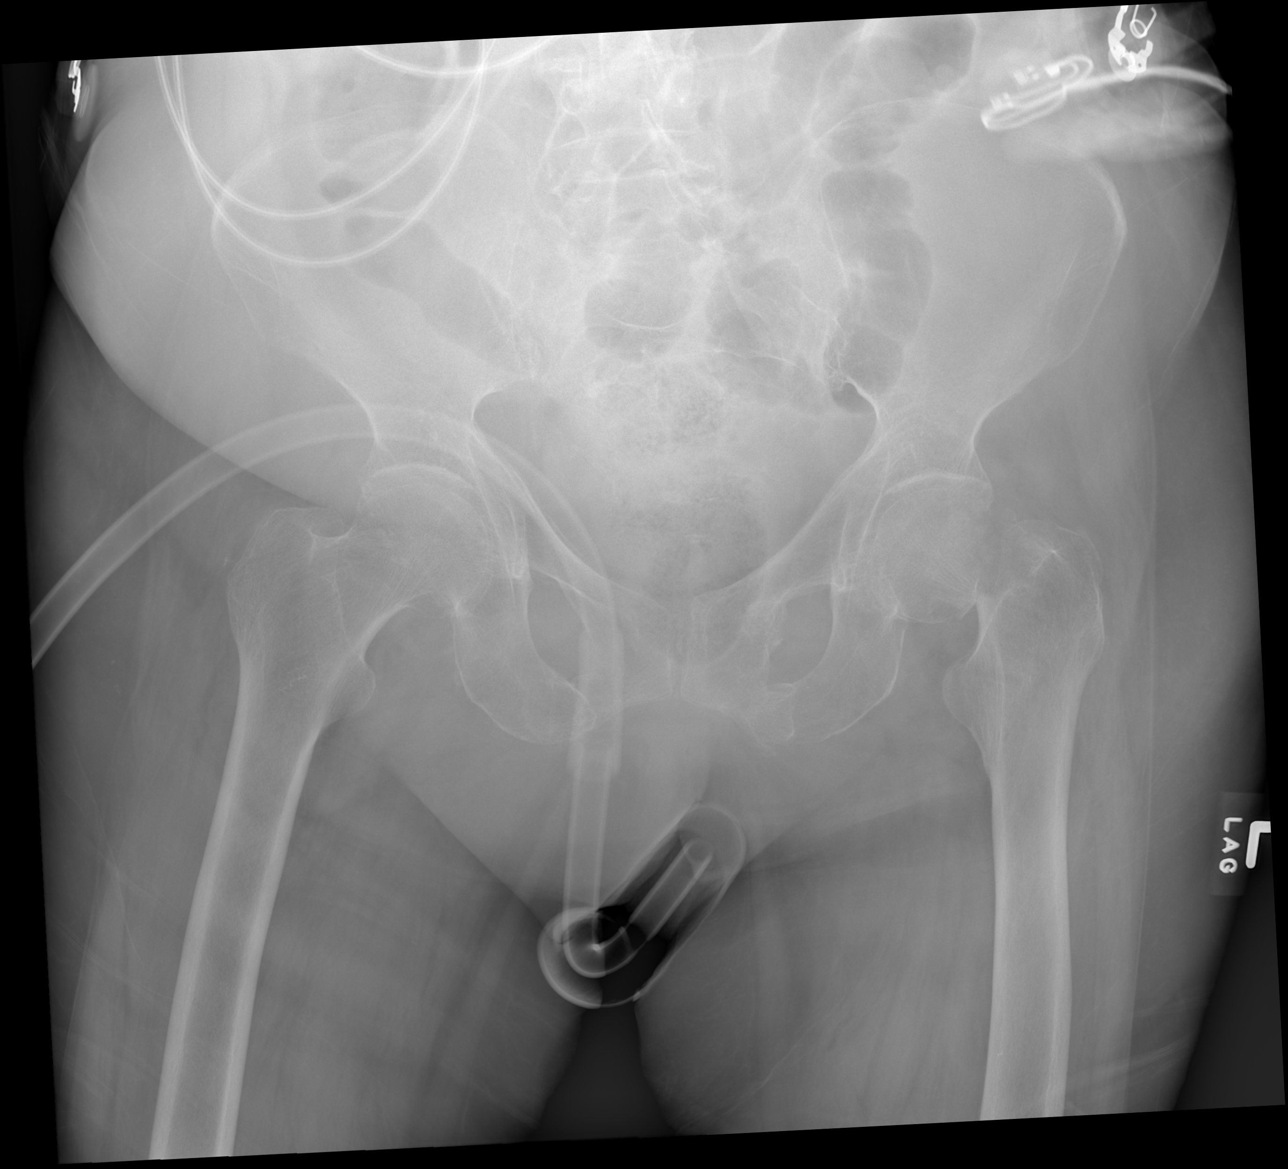

[x hip ap left]
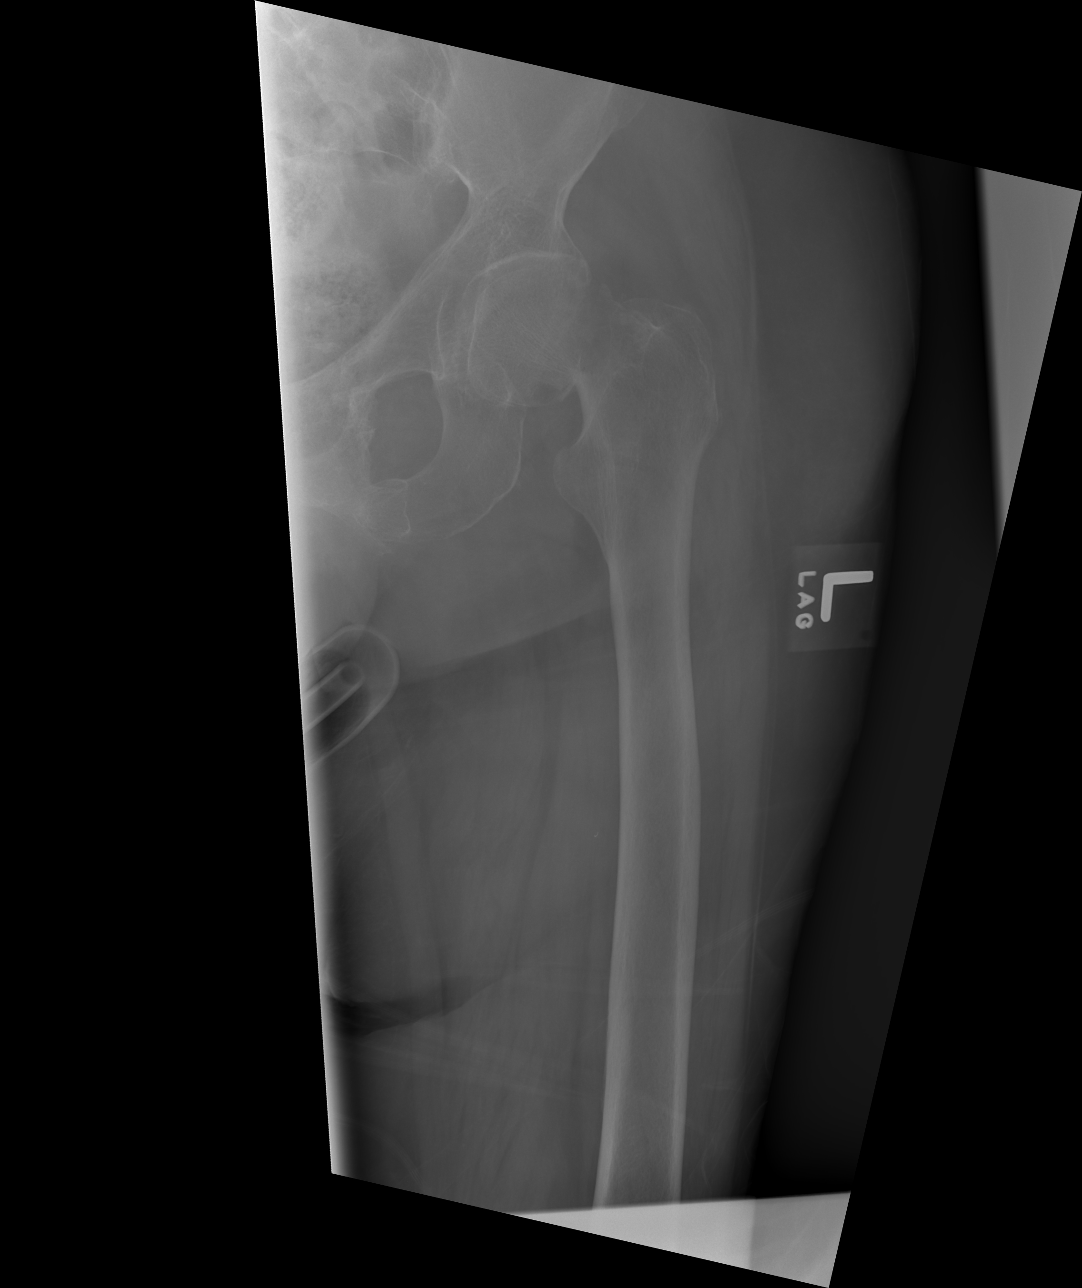

[3 of 3 positions shown; findings below may reference images not displayed]

FINDINGS: There is an acute subcapital fracture extending through the left
femoral neck with slight superior subluxation. Femoral head remains
normally aligned within the acetabulum. Femoral head height
maintained. Bony pelvis intact. Possible remotely healed fractures
noted at the left superior and inferior pubic rami. Limited views of
the right hip demonstrate no acute abnormality. Underlying
osteopenia noted. No visible soft tissue injury.

Degenerative changes noted within the lower lumbar spine.
IMPRESSION: Acute subcapital fracture of the left femoral neck with slight
superior subluxation.
